# Patient Record
Sex: Male | Born: 1965 | Race: Black or African American | Hispanic: No | Marital: Single | Smoking: Current every day smoker
Health system: Southern US, Community
[De-identification: ages and names within clinical notes are randomized; demographics above are authoritative.]

## PROBLEM LIST (undated history)

## (undated) DIAGNOSIS — M1990 Unspecified osteoarthritis, unspecified site: Secondary | ICD-10-CM

## (undated) DIAGNOSIS — I10 Essential (primary) hypertension: Secondary | ICD-10-CM

## (undated) HISTORY — PX: TONSILLECTOMY: SUR1361

---

## 1998-03-21 ENCOUNTER — Emergency Department (HOSPITAL_COMMUNITY): Admission: EM | Admit: 1998-03-21 | Discharge: 1998-03-21 | Payer: Self-pay | Admitting: Emergency Medicine

## 1999-02-27 ENCOUNTER — Emergency Department (HOSPITAL_COMMUNITY): Admission: EM | Admit: 1999-02-27 | Discharge: 1999-02-27 | Payer: Self-pay | Admitting: Emergency Medicine

## 1999-02-27 ENCOUNTER — Encounter: Payer: Self-pay | Admitting: Emergency Medicine

## 2005-01-05 ENCOUNTER — Emergency Department (HOSPITAL_COMMUNITY): Admission: EM | Admit: 2005-01-05 | Discharge: 2005-01-05 | Payer: Self-pay | Admitting: Emergency Medicine

## 2005-05-04 ENCOUNTER — Emergency Department (HOSPITAL_COMMUNITY): Admission: EM | Admit: 2005-05-04 | Discharge: 2005-05-04 | Payer: Self-pay | Admitting: Emergency Medicine

## 2005-11-17 ENCOUNTER — Emergency Department (HOSPITAL_COMMUNITY): Admission: EM | Admit: 2005-11-17 | Discharge: 2005-11-17 | Payer: Self-pay | Admitting: Emergency Medicine

## 2006-09-10 ENCOUNTER — Emergency Department (HOSPITAL_COMMUNITY): Admission: EM | Admit: 2006-09-10 | Discharge: 2006-09-11 | Payer: Self-pay | Admitting: *Deleted

## 2007-04-14 IMAGING — CR DG HIP COMPLETE 2+V*R*
3 series · 3 of 3 positions shown · non-contrast
Comparison: none

CLINICAL DATA: Pain.
 RIGHT HIP - 2 VIEW:
 There is no evidence of hip fracture or dislocation.  There is no evidence of arthropathy or other focal bone abnormality.

[t pelvis a.p.]
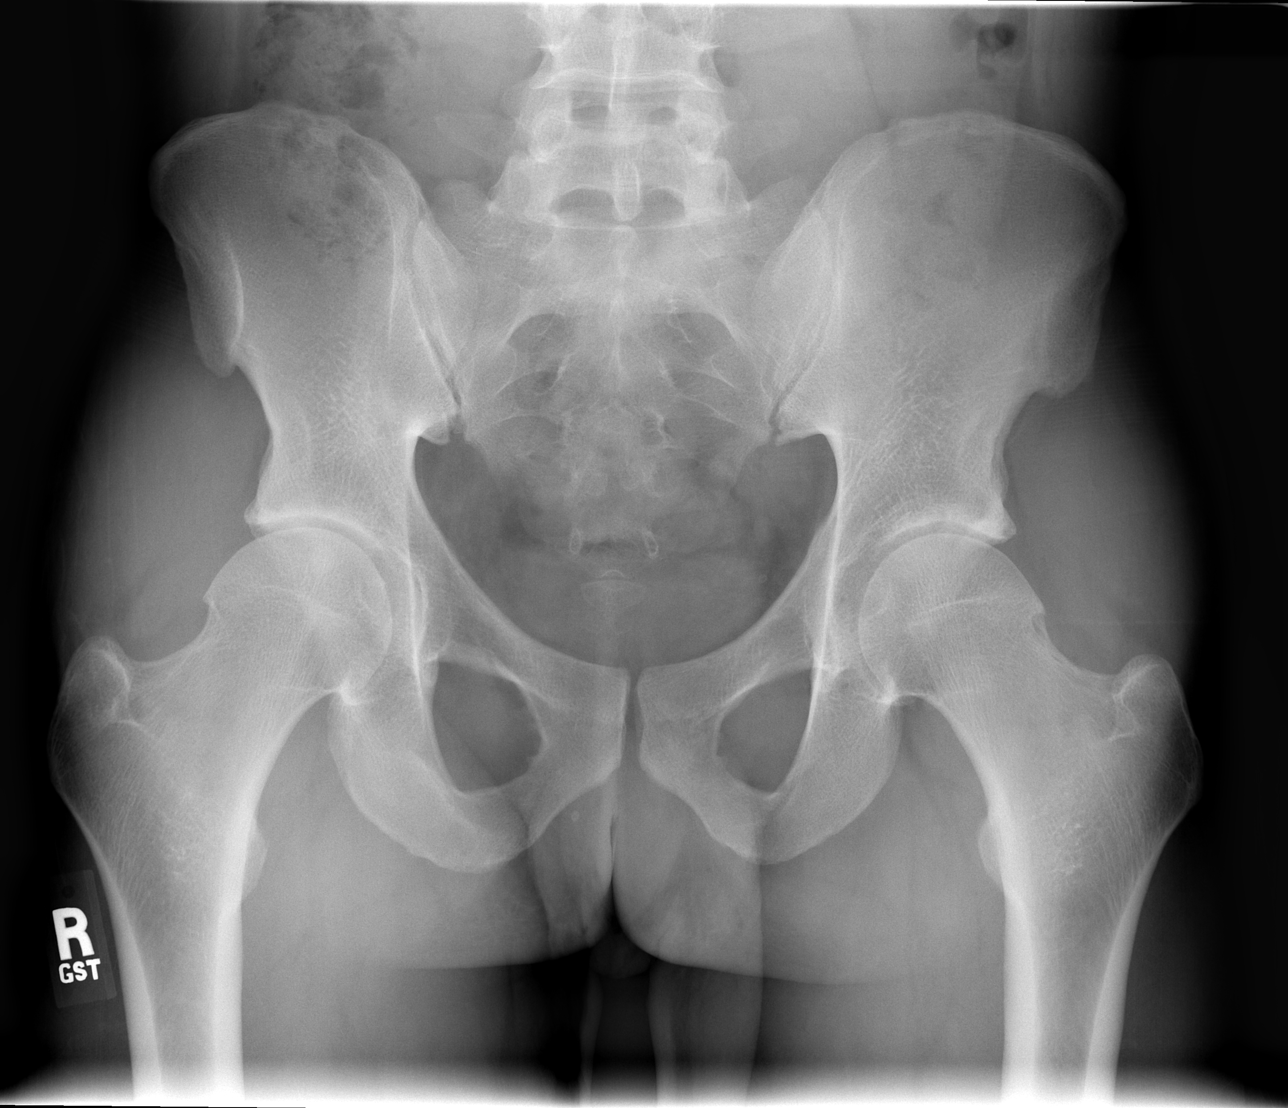

[t hip ap right]
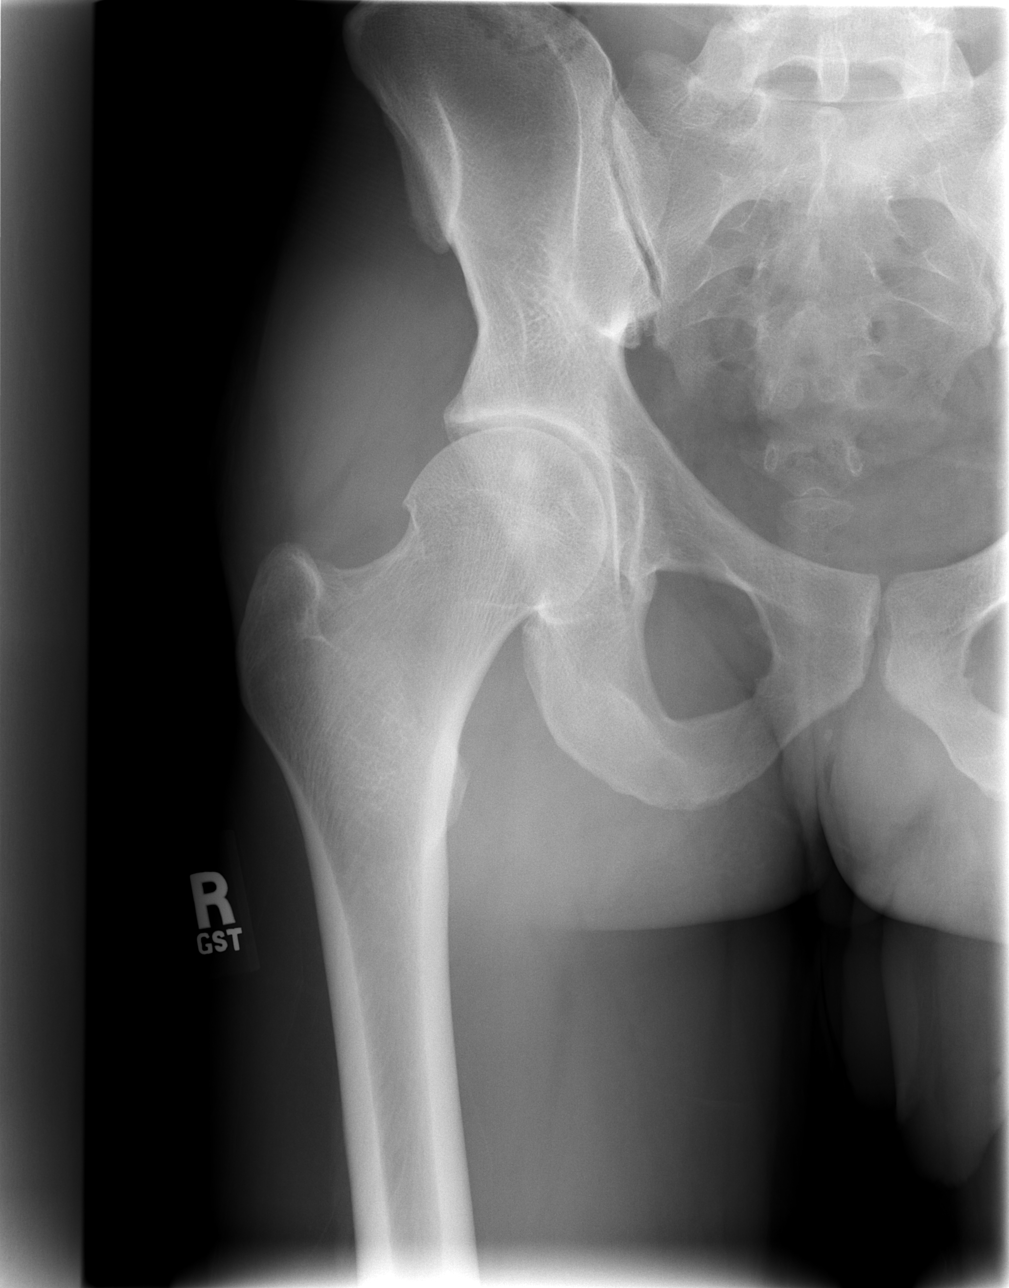

[t hip frog leg right]
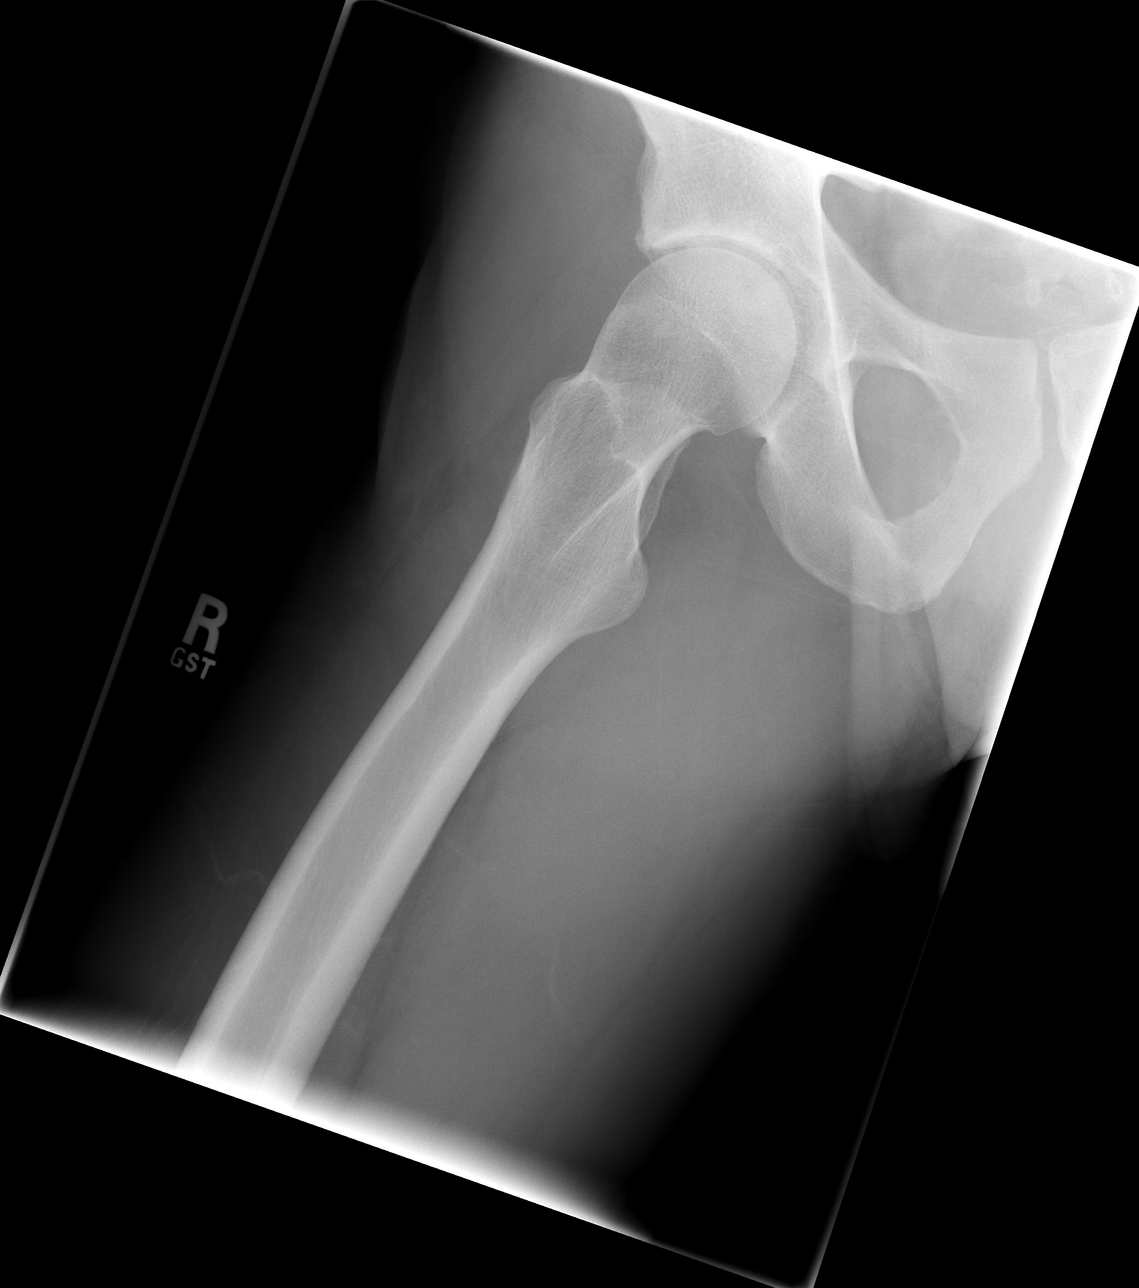

[3 of 3 positions shown; findings below may reference images not displayed]

IMPRESSION: Negative.

## 2007-04-27 ENCOUNTER — Emergency Department (HOSPITAL_COMMUNITY): Admission: EM | Admit: 2007-04-27 | Discharge: 2007-04-27 | Payer: Self-pay | Admitting: Emergency Medicine

## 2007-10-21 ENCOUNTER — Emergency Department (HOSPITAL_COMMUNITY): Admission: EM | Admit: 2007-10-21 | Discharge: 2007-10-21 | Payer: Self-pay | Admitting: Emergency Medicine

## 2007-12-28 ENCOUNTER — Emergency Department (HOSPITAL_COMMUNITY): Admission: EM | Admit: 2007-12-28 | Discharge: 2007-12-28 | Payer: Self-pay | Admitting: Emergency Medicine

## 2008-03-07 ENCOUNTER — Emergency Department (HOSPITAL_COMMUNITY): Admission: EM | Admit: 2008-03-07 | Discharge: 2008-03-08 | Payer: Self-pay | Admitting: Emergency Medicine

## 2008-04-14 ENCOUNTER — Emergency Department (HOSPITAL_COMMUNITY): Admission: EM | Admit: 2008-04-14 | Discharge: 2008-04-14 | Payer: Self-pay | Admitting: Emergency Medicine

## 2008-11-14 ENCOUNTER — Emergency Department (HOSPITAL_COMMUNITY): Admission: EM | Admit: 2008-11-14 | Discharge: 2008-11-14 | Payer: Self-pay | Admitting: Emergency Medicine

## 2009-01-11 ENCOUNTER — Emergency Department (HOSPITAL_COMMUNITY): Admission: EM | Admit: 2009-01-11 | Discharge: 2009-01-12 | Payer: Self-pay | Admitting: Emergency Medicine

## 2010-11-14 LAB — COMPREHENSIVE METABOLIC PANEL
ALT: 22 U/L (ref 0–53)
AST: 35 U/L (ref 0–37)
Alkaline Phosphatase: 68 U/L (ref 39–117)
CO2: 28 mEq/L (ref 19–32)
Calcium: 9.2 mg/dL (ref 8.4–10.5)
Chloride: 108 mEq/L (ref 96–112)
GFR calc Af Amer: >60 mL/min (ref >60)
GFR calc non Af Amer: >60 mL/min (ref >60)
Glucose, Bld: 154 mg/dL — ABNORMAL HIGH (ref 70–99)
Sodium: 144 mEq/L (ref 135–145)
Total Bilirubin: 0.7 mg/dL (ref 0.3–1.2)

## 2010-11-14 LAB — DIFFERENTIAL
Basophils Absolute: 0.1 10*3/uL (ref 0.0–0.1)
Basophils Relative: 0 % (ref 0–1)
Eosinophils Absolute: 0 10*3/uL (ref 0.0–0.7)
Eosinophils Relative: 0 % (ref 0–5)
Neutrophils Relative %: 84 % — ABNORMAL HIGH (ref 43–77)

## 2010-11-14 LAB — CBC
HCT: 42.7 % (ref 39.0–52.0)
Hemoglobin: 15 g/dL (ref 13.0–17.0)
MCHC: 35.2 g/dL (ref 30.0–36.0)
MCV: 86.7 fL (ref 78.0–100.0)
Platelets: 223 10*3/uL (ref 150–400)
RBC: 4.92 MIL/uL (ref 4.22–5.81)
RDW: 14.4 % (ref 11.5–15.5)
WBC: 18.5 10*3/uL — ABNORMAL HIGH (ref 4.0–10.5)

## 2010-11-14 LAB — GLUCOSE, CAPILLARY: Glucose-Capillary: 147 mg/dL — ABNORMAL HIGH (ref 70–99)

## 2010-11-14 LAB — LIPASE, BLOOD: Lipase: 22 U/L (ref 11–59)

## 2010-11-15 ENCOUNTER — Inpatient Hospital Stay (HOSPITAL_COMMUNITY)
Admission: EM | Admit: 2010-11-15 | Discharge: 2010-11-16 | DRG: 312 | Disposition: A | Discharge status: Home / Self Care | Payer: Self-pay | Attending: Internal Medicine | Admitting: Internal Medicine

## 2010-11-15 ENCOUNTER — Emergency Department (HOSPITAL_COMMUNITY): Payer: Self-pay

## 2010-11-15 DIAGNOSIS — D72829 Elevated white blood cell count, unspecified: Secondary | ICD-10-CM | POA: Diagnosis present

## 2010-11-15 DIAGNOSIS — R0789 Other chest pain: Secondary | ICD-10-CM | POA: Diagnosis present

## 2010-11-15 DIAGNOSIS — F17200 Nicotine dependence, unspecified, uncomplicated: Secondary | ICD-10-CM | POA: Diagnosis present

## 2010-11-15 DIAGNOSIS — Z9181 History of falling: Secondary | ICD-10-CM

## 2010-11-15 DIAGNOSIS — R55 Syncope and collapse: Principal | ICD-10-CM | POA: Diagnosis present

## 2010-11-15 LAB — CBC
MCH: 29.5 pg (ref 26.0–34.0)
MCV: 80.5 fL (ref 78.0–100.0)
Platelets: 225 10*3/uL (ref 150–400)
RDW: 13.3 % (ref 11.5–15.5)
WBC: 17.1 10*3/uL — ABNORMAL HIGH (ref 4.0–10.5)

## 2010-11-15 LAB — POCT I-STAT, CHEM 8
Calcium, Ion: 1 mmol/L — ABNORMAL LOW (ref 1.12–1.32)
Chloride: 105 mEq/L (ref 96–112)
Glucose, Bld: 85 mg/dL (ref 70–99)
HCT: 55 % — ABNORMAL HIGH (ref 39.0–52.0)
Hemoglobin: 18.7 g/dL — ABNORMAL HIGH (ref 13.0–17.0)
TCO2: 25 mmol/L (ref 0–100)

## 2010-11-15 LAB — TROPONIN I: Troponin I: 0.01 ng/mL (ref 0.00–0.06)

## 2010-11-15 LAB — DIFFERENTIAL
Basophils Relative: 0 % (ref 0–1)
Eosinophils Absolute: 0 10*3/uL (ref 0.0–0.7)
Eosinophils Relative: 0 % (ref 0–5)
Lymphs Abs: 2.4 10*3/uL (ref 0.7–4.0)
Neutrophils Relative %: 80 % — ABNORMAL HIGH (ref 43–77)

## 2010-11-15 LAB — PROTIME-INR: Prothrombin Time: 14.1 seconds (ref 11.6–15.2)

## 2010-11-15 LAB — POCT CARDIAC MARKERS: Myoglobin, poc: 104 ng/mL (ref 12–200)

## 2010-11-15 LAB — CK TOTAL AND CKMB (NOT AT ARMC)
CK, MB: 8.5 ng/mL (ref 0.3–4.0)
Total CK: 527 U/L — ABNORMAL HIGH (ref 7–232)

## 2010-11-16 DIAGNOSIS — R55 Syncope and collapse: Secondary | ICD-10-CM

## 2010-11-16 LAB — CARDIAC PANEL(CRET KIN+CKTOT+MB+TROPI)
CK, MB: 4.9 ng/mL — ABNORMAL HIGH (ref 0.3–4.0)
CK, MB: 4.3 ng/mL — ABNORMAL HIGH (ref 0.3–4.0)
Relative Index: 1.6 (ref 0.0–2.5)
Total CK: 298 U/L — ABNORMAL HIGH (ref 7–232)
Total CK: 273 U/L — ABNORMAL HIGH (ref 7–232)
Troponin I: <0.01 ng/mL (ref 0.00–0.06)
Troponin I: <0.01 ng/mL (ref 0.00–0.06)

## 2010-11-16 LAB — DIFFERENTIAL
Eosinophils Absolute: 0.1 10*3/uL (ref 0.0–0.7)
Eosinophils Relative: 1 % (ref 0–5)
Lymphs Abs: 4.1 10*3/uL — ABNORMAL HIGH (ref 0.7–4.0)
Monocytes Relative: 9 % (ref 3–12)

## 2010-11-16 LAB — CBC
MCH: 29.6 pg (ref 26.0–34.0)
MCHC: 36.8 g/dL — ABNORMAL HIGH (ref 30.0–36.0)
MCV: 80.5 fL (ref 78.0–100.0)
Platelets: 228 10*3/uL (ref 150–400)
RDW: 13.4 % (ref 11.5–15.5)

## 2010-11-16 LAB — BASIC METABOLIC PANEL
BUN: 12 mg/dL (ref 6–23)
Calcium: 8.5 mg/dL (ref 8.4–10.5)
Creatinine, Ser: 1.06 mg/dL (ref 0.4–1.5)
GFR calc non Af Amer: >60 mL/min (ref >60)

## 2010-11-16 LAB — LIPID PANEL
HDL: 40 mg/dL (ref >39)
Triglycerides: 69 mg/dL (ref <150)

## 2010-11-16 LAB — VITAMIN B12: Vitamin B-12: 508 pg/mL (ref 211–911)

## 2010-11-16 LAB — SEDIMENTATION RATE: Sed Rate: 1 mm/hr (ref 0–16)

## 2010-11-16 LAB — D-DIMER, QUANTITATIVE: D-Dimer, Quant: 0.22 ug/mL-FEU (ref 0.00–0.48)

## 2010-11-16 LAB — FOLATE: Folate: 8.4 ng/mL

## 2010-11-17 NOTE — H&P (Signed)
Lance Stone, Lance Stone               ACCOUNT NO.:  [id number]  MEDICAL RECORD NO.:  [id number]           PATIENT TYPE:  E  LOCATION:  [facility]                         FACILITY:  [facility]  PHYSICIAN:  Homero Fellers, MD   DATE OF BIRTH:  [birth year]  DATE OF ADMISSION:  11/15/2010 DATE OF DISCHARGE:                             HISTORY & PHYSICAL   PRIMARY CARE PHYSICIAN:  None.  CHIEF COMPLAINT:  Syncope and vague chest pain.  HISTORY OF PRESENT ILLNESS:  This is a 45 year old African American gentleman with no significant past medical history who presented with a witnessed syncopal episode earlier today.  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  He gave a unit of plasma earlier today and upon returning home he started feeling dizzy which persisted despite taking some water which he thought would make things better.  The patient fell forward bumping his chin against the kitchen table which required about 4-5 stitches in the emergency room.  He felt dizzy and lightheaded just before the fall.  There was no spinning sensation.  There was some accompanying nausea but no vomiting.  There was no fecal or urinary incontinence or any weakness or seizure activity.  The patient has no prior history of syncope.  In the emergency room, he was not hypotensive at any point.  When he presented, his EKG showed ST elevation and code STEMI was called, for which he was evaluated by cardiologist.  His ST elevation is in several leads and he also has some PR depression in leads II, III, and aVF.  His cardiac enzymes are unremarkable.  It was ruled as a case of pericarditis by cardiologist, so non-STEMI approach was not pursued.  The patient has been having vague chest pain over the past 1-2 months, which he describes as pleuritic and dull in nature.  He also described a pressure- like sensation over his chest.  He has never had a stress test before. There are no urinary  symptoms, leg swelling, palpitation, orthopnea, PND, or headaches.  PAST MEDICAL HISTORY:  None.  CURRENT MEDICATIONS:  None.  ALLERGIES:  None.  SOCIAL HISTORY:  Smokes about half pack per day, drinks 1 or 2 beers a day and on weekends takes two 14-ounce beer.  No IV drug abuse but uses marijuana.  FAMILY HISTORY:  Positive for four-vessel CABG in his uncle.  Ten-point review of systems is negative except as above.  PHYSICAL EXAMINATION:  VITAL SIGNS:  Blood pressure is 134/72, pulse 81, respirations 18, temperature is 98.7. GENERAL:  The patient is lying in bed, appeared comfortable, in no distress.  He is nonhypoxic at 97%. HEENT:  Pallor. NECK:  Supple.  No JVD. LUNGS:  Clear breath sounds bilaterally to auscultation. HEART:  S1 and S2.  No murmurs or gallops.  He has a clear friction rub when he leans forward. ABDOMEN:  Full, soft, nontender.  Bowel sounds present.  No masses. EXTREMITIES:  No edema, clubbing, or cyanosis. NEUROLOGIC:  No focal deficits.  LABORATORY DATA:  White count is 17,000 with mild left shift, hemoglobin is 17.4, platelet count is  225.  Chemistry: Sodium is 142, potassium 4, BUN 12, creatinine 1.3, glucose 85.  Cardiac enzymes x1 is negative. CT of the head showed no acute finding. CT of the cervical spine also showed no fracture or subluxation. Urinalysis is pending. Again, EKG showed normal sinus rhythm with diffuse ST-elevation in precordial leads with accompanying PR depression. Chest x-ray showed no acute cardiopulmonary disease.  ASSESSMENT:  This is a 45 year old man admitted with: 1. Syncope to rule out a witnessed period of vasovagal episode is most     likely but we also need to rule out structural heart disease. 2. Acute pericarditis. 3. Leukocytosis.  This may also support the diagnosis of acute     pericarditis.  There is no other focus of infection at this time. 4. Tobacco abuse.  PLAN:  Admit to telemetry.  Get serial cardiac  enzymes and further EKG. Get ESR.  Check lipid profile tomorrow.  Get two sets of blood cultures. The patient will be followed by cardiologist on this admission.  A 2-D echo will be requested as well as carotid Doppler.  The patient will be commenced on ibuprofen 600 mg t.i.d. for treatment of pericarditis.  I will also put him on proton pump inhibitor to protect his stomach.  B12 and folic acid levels will be checked.  He will be on NicoDerm for tobacco abuse.  His condition is fairly stable.     Homero Fellers, MD     FA/MEDQ  D:  11/15/2010  T:  11/15/2010  Job:  [id number]  Electronically Signed by Homero Fellers  on 11/17/2010 09:28:07 AM

## 2010-11-22 LAB — CULTURE, BLOOD (ROUTINE X 2)
Culture  Setup Time: 201204110908
Culture: NO GROWTH

## 2010-11-22 NOTE — Discharge Summary (Signed)
Lance Stone, Lance Stone               ACCOUNT NO.:  [id number]  MEDICAL RECORD NO.:  [id number]           PATIENT TYPE:  I  LOCATION:  4735                         FACILITY:  [facility]  PHYSICIAN:  Hartley Barefoot, MD    DATE OF BIRTH:  08/21/1965  DATE OF ADMISSION:  11/15/2010 DATE OF DISCHARGE:  11/16/2010                              DISCHARGE SUMMARY   DISCHARGE DIAGNOSIS: 1. Atypical chest pain. 2. Vasovagal syncope.  REVIEW OF SYSTEMS: 1. Left ventricular hypertrophy. 2. Abnormal EKG with ST elevations thought to be secondary to left     ventricular hypertrophy per Cardiology recommendations.  DISCHARGE MEDICATIONS: 1. Tylenol 650 mg every 4 hours as needed for pain. 2. Protonix 40 mg p.o. daily. 3. Ibuprofen 2 tablets by mouth every 6 hours as needed for pain.  MEDICATIONS STOPPED:  Aleve, naproxen.  DISPOSITION AND FOLLOWUP:  Lance Stone will follow with [facility] for left ventricular hypertrophy.  BRIEF HISTORY OF PRESENT ILLNESS:  This is a 45 year old with no prior past medical history who presents after a witnessed syncopal episode. 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  He gave a unit of plasma early on the morning of admission, and upon returning home he started feeling dizzy which persisted by taking some water which he thought will make things better.  He felt poor and hit his chin against the kitchen table.  He required 4-5 stitches in the emergency room.  He felt dizzy and lightheadedness prior to the fall.  He was found to have an EKG, ST elevation, and code STEMI was called for which Cardiology evaluated the patient.  Cardiology canceled the code.  He relates some on and off chest pain for the last 2 or 3 months, worse with stretching and deep breaths.  HOSPITAL COURSE: 1. Syncope, likely vasovagal, no focal neurologic deficit.  Carotid     Dopplers show clean coronary artery.  A 2-D echo was     negative for  aortic stenosis. 2. Chest pain, atypical, positional and pressure like.  I will get a D-     dimer.  If D-dimer is negative, the patient will be discharged     home.  He denies chest pain at this time.  He will be discharged on     Protonix daily.  Cardiology does not think that this is     pericarditis.  He will need to follow with Cardiology for further     evaluation of left ventricular hypertrophy.  A 2-D echo showed     normal ejection fraction and left ventricular hypertrophy. 3. Leukocytosis, likely stress-related hemoconcentration.  The patient     afebrile.  White blood cell decreased to 14 off antibiotics.  On the day of discharge, the patient was in improved condition, no chest pain.  Blood pressure 122/70, sating 100 on room air, respirations 20, pulse 67, temperature 98.2.  The patient was discharged in improved condition.     Hartley Barefoot, MD     BR/MEDQ  D:  11/16/2010  T:  11/17/2010  Job:  [id number]  Electronically Signed by  Hartley Barefoot MD on 11/22/2010 09:03:46 PM

## 2011-03-17 ENCOUNTER — Emergency Department (HOSPITAL_COMMUNITY): Payer: Self-pay

## 2011-03-17 ENCOUNTER — Emergency Department (HOSPITAL_COMMUNITY)
Admission: EM | Admit: 2011-03-17 | Discharge: 2011-03-17 | Discharge status: Against Medical Advice | Payer: Self-pay | Attending: Emergency Medicine | Admitting: Emergency Medicine

## 2011-03-17 DIAGNOSIS — Y9289 Other specified places as the place of occurrence of the external cause: Secondary | ICD-10-CM | POA: Insufficient documentation

## 2011-03-17 DIAGNOSIS — R296 Repeated falls: Secondary | ICD-10-CM | POA: Insufficient documentation

## 2011-03-17 DIAGNOSIS — M25569 Pain in unspecified knee: Secondary | ICD-10-CM | POA: Insufficient documentation

## 2011-05-23 ENCOUNTER — Emergency Department (HOSPITAL_COMMUNITY)
Admission: EM | Admit: 2011-05-23 | Discharge: 2011-05-24 | Disposition: A | Discharge status: Other Acute Inpatient Hospital | Payer: Self-pay | Attending: Emergency Medicine | Admitting: Emergency Medicine

## 2011-05-23 DIAGNOSIS — F329 Major depressive disorder, single episode, unspecified: Secondary | ICD-10-CM | POA: Insufficient documentation

## 2011-05-23 DIAGNOSIS — F3289 Other specified depressive episodes: Secondary | ICD-10-CM | POA: Insufficient documentation

## 2011-05-24 ENCOUNTER — Inpatient Hospital Stay (HOSPITAL_COMMUNITY)
Admission: EM | Admit: 2011-05-24 | Discharge: 2011-05-24 | DRG: 881 | Disposition: A | Discharge status: Home / Self Care | Payer: PRIVATE HEALTH INSURANCE | Attending: Psychiatry | Admitting: Psychiatry

## 2011-05-24 DIAGNOSIS — F1010 Alcohol abuse, uncomplicated: Secondary | ICD-10-CM

## 2011-05-24 DIAGNOSIS — F1220 Cannabis dependence, uncomplicated: Secondary | ICD-10-CM

## 2011-05-24 DIAGNOSIS — F39 Unspecified mood [affective] disorder: Secondary | ICD-10-CM

## 2011-05-24 DIAGNOSIS — F4321 Adjustment disorder with depressed mood: Principal | ICD-10-CM

## 2011-05-24 LAB — DIFFERENTIAL
Basophils Absolute: 0 10*3/uL (ref 0.0–0.1)
Eosinophils Absolute: 0.1 10*3/uL (ref 0.0–0.7)
Eosinophils Relative: 1 % (ref 0–5)
Lymphocytes Relative: 31 % (ref 12–46)
Monocytes Absolute: 1.2 10*3/uL — ABNORMAL HIGH (ref 0.1–1.0)

## 2011-05-24 LAB — RAPID URINE DRUG SCREEN, HOSP PERFORMED
Amphetamines: NOT DETECTED
Benzodiazepines: NOT DETECTED
Opiates: NOT DETECTED
Tetrahydrocannabinol: POSITIVE — AB

## 2011-05-24 LAB — COMPREHENSIVE METABOLIC PANEL
AST: 23 U/L (ref 0–37)
Albumin: 4.2 g/dL (ref 3.5–5.2)
Alkaline Phosphatase: 78 U/L (ref 39–117)
BUN: 13 mg/dL (ref 6–23)
Creatinine, Ser: 0.93 mg/dL (ref 0.50–1.35)
Potassium: 3.8 mEq/L (ref 3.5–5.1)
Total Protein: 7.5 g/dL (ref 6.0–8.3)

## 2011-05-24 LAB — ETHANOL: Alcohol, Ethyl (B): <11 mg/dL (ref 0–11)

## 2011-05-24 LAB — CBC
HCT: 41.3 % (ref 39.0–52.0)
MCHC: 36.8 g/dL — ABNORMAL HIGH (ref 30.0–36.0)
Platelets: 259 10*3/uL (ref 150–400)
RDW: 13.7 % (ref 11.5–15.5)
WBC: 12.7 10*3/uL — ABNORMAL HIGH (ref 4.0–10.5)

## 2011-05-26 NOTE — Discharge Summary (Signed)
  Lance Stone, Lance Stone NO.:  [id number]  MEDICAL RECORD NO.:  [id number]  LOCATION:  0301                          FACILITY:  [facility]  PHYSICIAN:  Orson Aloe, MD       DATE OF BIRTH:  1966/01/19  DATE OF ADMISSION:  05/24/2011 DATE OF DISCHARGE:  05/24/2011                              DISCHARGE SUMMARY   REASON FOR HOSPITALIZATION:  The patient had been drinking and was requesting to talk to a counselor and when he was talking to the counselor he said something about he might want to hurt himself.  He is now sobered up in the emergency room, and has no desire to hurt himself. His alcohol level was actually less than 11 in the emergency room.  SIGNIFICANT FINDINGS:  Is that his labs and tests were all within normal limits.  He has no suicidal or homicidal ideation noted and he sees the need for continued counseling but no need for any other treatment. There does not seem to be any need obvious in the interview setting.  FINAL DIAGNOSIS:  Adjustment disorder with depressed mood, alcohol use.  CONDITION ON DISCHARGE:  Stable.  Discharged to his own recognizance.          ______________________________ Orson Aloe, MD     EW/MEDQ  D:  05/24/2011  T:  05/25/2011  Job:  [id number]  Electronically Signed by Orson Aloe  on 05/26/2011 10:23:07 AM

## 2011-11-10 ENCOUNTER — Emergency Department (HOSPITAL_COMMUNITY)
Admission: EM | Admit: 2011-11-10 | Discharge: 2011-11-10 | Disposition: A | Discharge status: Home / Self Care | Payer: Self-pay | Attending: Emergency Medicine | Admitting: Emergency Medicine

## 2011-11-10 ENCOUNTER — Emergency Department (HOSPITAL_COMMUNITY): Payer: Self-pay

## 2011-11-10 ENCOUNTER — Encounter (HOSPITAL_COMMUNITY): Payer: Self-pay | Admitting: *Deleted

## 2011-11-10 DIAGNOSIS — M25473 Effusion, unspecified ankle: Secondary | ICD-10-CM | POA: Insufficient documentation

## 2011-11-10 DIAGNOSIS — M25476 Effusion, unspecified foot: Secondary | ICD-10-CM | POA: Insufficient documentation

## 2011-11-10 DIAGNOSIS — S93499A Sprain of other ligament of unspecified ankle, initial encounter: Secondary | ICD-10-CM | POA: Insufficient documentation

## 2011-11-10 DIAGNOSIS — M25579 Pain in unspecified ankle and joints of unspecified foot: Secondary | ICD-10-CM | POA: Insufficient documentation

## 2011-11-10 DIAGNOSIS — S96819A Strain of other specified muscles and tendons at ankle and foot level, unspecified foot, initial encounter: Secondary | ICD-10-CM | POA: Insufficient documentation

## 2011-11-10 DIAGNOSIS — S86019A Strain of unspecified Achilles tendon, initial encounter: Secondary | ICD-10-CM

## 2011-11-10 DIAGNOSIS — W010XXA Fall on same level from slipping, tripping and stumbling without subsequent striking against object, initial encounter: Secondary | ICD-10-CM | POA: Insufficient documentation

## 2011-11-10 MED ORDER — IBUPROFEN 800 MG PO TABS
800.0000 mg | ORAL_TABLET | Freq: Once | ORAL | Status: AC
  Administered 2011-11-10: 800 mg via ORAL
  Filled 2011-11-10: qty 1

## 2011-11-10 MED ORDER — IBUPROFEN 800 MG PO TABS: 800.0000 mg | ORAL_TABLET | Freq: Three times a day (TID) | ORAL | Status: AC

## 2011-11-10 MED ORDER — OXYCODONE-ACETAMINOPHEN 5-325 MG PO TABS: 2.0000 | ORAL_TABLET | ORAL | Status: AC | PRN

## 2011-11-10 NOTE — ED Notes (Signed)
Pt states falling and feeling ankle and achilles tendon 'pop' while running 2 days ago. Swelling noted to lower right extremity. Tenderness palpated over tendon. Cms/rom intact. Pt in no acute distress. Ambulatory with crutches.

## 2011-11-10 NOTE — ED Notes (Signed)
Called ortho.  

## 2011-11-10 NOTE — ED Provider Notes (Signed)
History     CSN: [id number]  Arrival date & time 11/10/11  2440   First MD Initiated Contact with Patient 11/10/11 1105      Chief Complaint  Patient presents with  . Ankle Pain    (Consider location/radiation/quality/duration/timing/severity/associated sxs/prior treatment) Patient is a 47 y.o. male presenting with ankle pain.  Ankle Pain     Patient states he injured his right ankle several days ago he was racing a friend. He states he began racing and fell to the ground. He states that the pain began at this time. He states that he tore his Achilles tendon. He is asked if he injured himself when he fell and replies "you just don't get it". I "I told you it happened" patient does not answer any other questions about the way he was injured or what the symptoms have been like since his injury stating that he has pain that we can see in his right ankle.  History reviewed. No pertinent past medical history.  Past Surgical History  Procedure Date  . Tonsillectomy     History reviewed. No pertinent family history.  History  Substance Use Topics  . Smoking status: Current Everyday Smoker    Types: Cigarettes  . Smokeless tobacco: Not on file  . Alcohol Use: Yes     occasional      Review of Systems  Constitutional:       The patient denies recent fluoroquinolone use.  All other systems reviewed and are negative.    Allergies  Review of patient's allergies indicates no known allergies.  Home Medications   Current Outpatient Rx  Name Route Sig Dispense Refill  . POLYVINYL ALCOHOL 1.4 % OP SOLN  1 drop as needed.      BP 136/85  Pulse 100  Temp(Src) 97.8 F (36.6 C) (Oral)  Resp 16  SpO2 98%  Physical Exam  Vitals reviewed. Constitutional: He is oriented to person, place, and time. He appears well-developed and well-nourished.  HENT:  Head: Normocephalic and atraumatic.  Musculoskeletal:       Right foot: He exhibits tenderness and swelling. He exhibits  no crepitus and no deformity.       Feet:       Patient refuses further exam after initial palpation but is tender diffusely over the Achilles tendon. There is not appear to be any bony tenderness. Dorsal talus pulses are 2+. Patient can plantar flex his foot against mild pressure. No calf deformity is noted. \ Thompson's test is positive on the right.  Neurological: He is alert and oriented to person, place, and time.  Skin: Skin is warm and dry.  Psychiatric: His affect is angry and inappropriate. He is agitated and aggressive.    ED Course  Procedures (including critical care time)  Labs Reviewed - No data to display No results found.   No diagnosis found.    MDM  No information on file.  Dg Ankle Complete Right  11/10/2011  *RADIOLOGY REPORT*  Clinical Data: Pain and swelling.  Fell 2 days ago.  RIGHT ANKLE - COMPLETE 3+ VIEW  Comparison: None.  Findings: Lateral malleolar soft tissue swelling.  No fracture or dislocation.  IMPRESSION: .  Soft tissue swelling lateral malleolar region.  No fracture or dislocation.  Original Report Authenticated By: Fuller Canada, M.D.    Patient may have partial rupture of his Achilles tendon. He will need to be reexamined when the swelling and pain have decreased. He is currently on crutches  player. He'll be placed in a splint and referred to orthopedics. Patient's care discussed with Dr. Rayburn Ma. Patient is placed in posterior splint with plantar flexion. He is instructed to followup in the office next week.       Hilario Quarry, MD 11/10/11 1210

## 2011-11-10 NOTE — Discharge Instructions (Signed)
Partial (Incomplete) Achilles Tendon Rupture  Tendons are the tough fibrous and stretchy (elastic) tissues that connect muscle to bone. The Achilles tendon is the large cord-like structure (tendon) in the back of the leg, just above the foot. It attaches the large muscles of the lower leg to the heel bone. You can feel this as the large cord just above the heel. The diagnosis of incomplete Achilles tendon tear (rupture) is made by examination. X-rays will determine the extent of the damage (injury). The injury may be casted or immobilized for 6 to 10 weeks. An incomplete tear of the tendon may repair itself with rest and immobilization. Immobilization means that the injured tendon is kept in position with a cast or splint. It is not allowed to move. Once your caregiver feels you have healed well enough, he or she will provide exercises you can do to make the injured tendon feel better (rehabilitate). HOME CARE INSTRUCTIONS   Keep the leg elevated above the level of the heart (the center of the chest) at all times when not using the bathroom. Do not dangle the leg over a chair, couch, or bed. When lying down, elevate your leg on a couple pillows. Elevation prevents swelling and reduces pain.   Apply ice to the injury for 15 to 20 minutes, 3 to 4 times per day. Put the ice in a plastic bag and place a towel between the bag of ice and your skin, splint, or immobilization device.   Use crutches and move about only as instructed.   Avoid use other than gentle range of motion of the toes while the tendon is painful. Do not resume use until instructed by your caregiver. Usually, full rehabilitation will begin sometime after the casts or splints are removed. Then begin use gradually, as directed. Do not increase use to the point of pain. If pain does develop, decrease use and continue the above measures. Gradually increase activities that do not cause discomfort until you achieve normal use without pain.   Do  not drive a car until your caregiver specifically tells you it is safe to do so.   Only take over-the-counter or prescription medicines for pain, discomfort, or fever as directed by your caregiver.   If your caregiver has given you a follow-up appointment, it is very important to keep that appointment. Not keeping the appointment could result in a chronic or permanent injury, pain, and disability. If there is any problem keeping the appointment, you must call back to this facility for assistance.  SEEK MEDICAL CARE IF:   Your pain and swelling increase or pain is uncontrolled with medications.   You develop new unexplained problems (symptoms) or an increase of the symptoms which brought you to your caregiver.   You develop an inability to move your toes or foot, develop warmth and swelling in your foot, or begin running an unexplained temperature.  MAKE SURE YOU:   Understand these instructions.   Will watch your condition.   Will get help right away if you are not doing well or get worse.  Document Released: 05/03/2005 Document Revised: 07/13/2011 Document Reviewed: 02/25/2008 [employer] Patient Information 2012 [employer]. 

## 2011-11-10 NOTE — ED Notes (Signed)
To ed for eval of right ankle/foot pain since running a cple days ago. States he thinks he has injured his achilles tendon. Ambulatory with crutches

## 2012-04-01 ENCOUNTER — Encounter (HOSPITAL_COMMUNITY): Payer: Self-pay | Admitting: Family Medicine

## 2012-04-01 ENCOUNTER — Emergency Department (HOSPITAL_COMMUNITY)
Admission: EM | Admit: 2012-04-01 | Discharge: 2012-04-01 | Disposition: A | Discharge status: Home / Self Care | Payer: Self-pay | Attending: Emergency Medicine | Admitting: Emergency Medicine

## 2012-04-01 DIAGNOSIS — F17200 Nicotine dependence, unspecified, uncomplicated: Secondary | ICD-10-CM | POA: Insufficient documentation

## 2012-04-01 DIAGNOSIS — M545 Low back pain, unspecified: Secondary | ICD-10-CM

## 2012-04-01 DIAGNOSIS — I10 Essential (primary) hypertension: Secondary | ICD-10-CM | POA: Insufficient documentation

## 2012-04-01 HISTORY — DX: Essential (primary) hypertension: I10

## 2012-04-01 LAB — URINALYSIS, ROUTINE W REFLEX MICROSCOPIC
Bilirubin Urine: NEGATIVE
Glucose, UA: NEGATIVE mg/dL
Hgb urine dipstick: NEGATIVE
Ketones, ur: NEGATIVE mg/dL
Leukocytes, UA: NEGATIVE
Nitrite: NEGATIVE
Protein, ur: NEGATIVE mg/dL
Specific Gravity, Urine: 1.022 (ref 1.005–1.030)
Urobilinogen, UA: 1 mg/dL (ref 0.0–1.0)
pH: 6 (ref 5.0–8.0)

## 2012-04-01 MED ORDER — IBUPROFEN 400 MG PO TABS
800.0000 mg | ORAL_TABLET | Freq: Once | ORAL | Status: AC
  Administered 2012-04-01: 800 mg via ORAL
  Filled 2012-04-01: qty 2

## 2012-04-01 MED ORDER — IBUPROFEN 800 MG PO TABS: 800.0000 mg | ORAL_TABLET | Freq: Three times a day (TID) | ORAL | Status: AC

## 2012-04-01 MED ORDER — CYCLOBENZAPRINE HCL 10 MG PO TABS: 10.0000 mg | ORAL_TABLET | Freq: Three times a day (TID) | ORAL | Status: AC | PRN

## 2012-04-01 MED ORDER — HYDROCODONE-ACETAMINOPHEN 5-325 MG PO TABS: ORAL_TABLET | ORAL | Status: AC

## 2012-04-01 NOTE — ED Provider Notes (Signed)
History     CSN: [id number]  Arrival date & time 04/01/12  1248   First MD Initiated Contact with Patient 04/01/12 1339      Chief Complaint  Patient presents with  . Back Pain    (Consider location/radiation/quality/duration/timing/severity/associated sxs/prior treatment) HPI Comments: Pt reports no trauma, 3 days of low back right side, worse with bending, twisting or movement of torso.  No fevers, chills, no N/V/D.  No hematuria or burning with urination.  No skin rash, SOB, coughing.  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  Has not taken any meds for this.  No urinary difficulty, no numbness or weakness in legs.    Patient is a 46 y.o. male presenting with back pain. The history is provided by the patient.  Back Pain  Pertinent negatives include no chest pain, no fever, no numbness, no dysuria and no weakness.    Past Medical History  Diagnosis Date  . Hypertension     Past Surgical History  Procedure Date  . Tonsillectomy     History reviewed. No pertinent family history.  History  Substance Use Topics  . Smoking status: Current Everyday Smoker    Types: Cigarettes  . Smokeless tobacco: Not on file  . Alcohol Use: Yes     occasional      Review of Systems  Constitutional: Negative for fever and chills.  Respiratory: Negative for cough, chest tightness and shortness of breath.   Cardiovascular: Negative for chest pain.  Genitourinary: Negative for dysuria, frequency, hematuria, flank pain and difficulty urinating.  Musculoskeletal: Positive for back pain.  Neurological: Negative for weakness and numbness.    Allergies  Review of patient's allergies indicates no known allergies.  Home Medications   Current Outpatient Rx  Name Route Sig Dispense Refill  . CYCLOBENZAPRINE HCL 10 MG PO TABS Oral Take 1 tablet (10 mg total) by mouth 3 (three) times daily as needed for muscle spasms. 12 tablet 0  . HYDROCODONE-ACETAMINOPHEN 5-325 MG PO TABS   1-2 tablets po q 6 hours prn moderate to severe pain 20 tablet 0  . IBUPROFEN 800 MG PO TABS Oral Take 1 tablet (800 mg total) by mouth 3 (three) times daily. 21 tablet 0    BP 122/79  Pulse 88  Temp 98.7 F (37.1 C) (Oral)  Resp 17  SpO2 100%  Physical Exam  Nursing note and vitals reviewed. Constitutional: He is oriented to person, place, and time. He appears well-developed and well-nourished.  HENT:  Head: Normocephalic and atraumatic.  Neck: Normal range of motion. Neck supple.  Cardiovascular: Normal rate.   Pulmonary/Chest: Effort normal.  Abdominal: Soft. He exhibits no distension. There is no tenderness. There is no rebound.  Musculoskeletal:       Lumbar back: He exhibits tenderness. He exhibits no bony tenderness, no edema, no deformity, no laceration, no pain and no spasm.       Back:       No deformity to back  Neurological: He is alert and oriented to person, place, and time.  Skin: Skin is warm. No rash noted.    ED Course  Procedures (including critical care time)   Labs Reviewed  URINALYSIS, ROUTINE W REFLEX MICROSCOPIC  LAB REPORT - SCANNED   No results found.   1. Lumbar pain      Ua is unremarkable.  Focal tenderness, no radiculopathy present at this time.  No urinary hesitancy or retention or incontinence noted.   MDM  Likely  muscular pain, spasm.  Pt couldn't sleep last night.  Didn't take any meds.  No radiation of pain, doesn't seem radicular, no vomiting or urinary symptoms.  If UA is ok, will treat as spasm.          Gavin Pound. Kiandra Sanguinetti, MD 04/02/12 1625

## 2012-04-01 NOTE — ED Notes (Signed)
Per pt back pain x 3 or 4 days on the right side denies injury. Denies any urinary symptoms. Denies radiation of pain.

## 2012-04-01 NOTE — ED Notes (Signed)
C/o right flank pain x 3-4 days, denies injury, UTI Sx's. Reports pain worsens with mvmt, area tender to palpation

## 2012-04-01 NOTE — Discharge Instructions (Signed)
 Back Exercises Back exercises help treat and prevent back injuries. The goal of back exercises is to increase the strength of your abdominal and back muscles and the flexibility of your back. These exercises should be started when you no longer have back pain. Back exercises include:  Pelvic Tilt. Lie on your back with your knees bent. Tilt your pelvis until the lower part of your back is against the floor. Hold this position 5 to 10 sec and repeat 5 to 10 times.   Knee to Chest. Pull first 1 knee up against your chest and hold for 20 to 30 seconds, repeat this with the other knee, and then both knees. This may be done with the other leg straight or bent, whichever feels better.   Sit-Ups or Curl-Ups. Bend your knees 90 degrees. Start with tilting your pelvis, and do a partial, slow sit-up, lifting your trunk only 30 to 45 degrees off the floor. Take at least 2 to 3 seconds for each sit-up. Do not do sit-ups with your knees out straight. If partial sit-ups are difficult, simply do the above but with only tightening your abdominal muscles and holding it as directed.   Hip-Lift. Lie on your back with your knees flexed 90 degrees. Push down with your feet and shoulders as you raise your hips a couple inches off the floor; hold for 10 seconds, repeat 5 to 10 times.   Back arches. Lie on your stomach, propping yourself up on bent elbows. Slowly press on your hands, causing an arch in your low back. Repeat 3 to 5 times. Any initial stiffness and discomfort should lessen with repetition over time.   Shoulder-Lifts. Lie face down with arms beside your body. Keep hips and torso pressed to floor as you slowly lift your head and shoulders off the floor.  Do not overdo your exercises, especially in the beginning. Exercises may cause you some mild back discomfort which lasts for a few minutes; however, if the pain is more severe, or lasts for more than 15 minutes, do not continue exercises until you see your  caregiver. Improvement with exercise therapy for back problems is slow.  See your caregivers for assistance with developing a proper back exercise program. Document Released: 08/31/2004 Document Revised: 07/13/2011 Document Reviewed: 07/24/2005 [facility] Patient Information 2012 [place], [place].    Back Pain, Adult Back pain is very common. The pain often gets better over time. The cause of back pain is usually not dangerous. Most people can learn to manage their back pain on their own.  HOME CARE   Stay active. Start with short walks on flat ground if you can. Try to walk farther each day.   Do not sit, drive, or stand in one place for more than 30 minutes. Do not stay in bed.   Do not avoid exercise or work. Activity can help your back heal faster.   Be careful when you bend or lift an object. Bend at your knees, keep the object close to you, and do not twist.   Sleep on a firm mattress. Lie on your side, and bend your knees. If you lie on your back, put a pillow under your knees.   Only take medicines as told by your doctor.   Put ice on the injured area.   Put ice in a plastic bag.   Place a towel between your skin and the bag.   Leave the ice on for 15 to 20 minutes, 3 to 4 times a  day for the first 2 to 3 days. After that, you can switch between ice and heat packs.   Ask your doctor about back exercises or massage.   Avoid feeling anxious or stressed. Find good ways to deal with stress, such as exercise.  GET HELP RIGHT AWAY IF:   Your pain does not go away with rest or medicine.   Your pain does not go away in 1 week.   You have new problems.   You do not feel well.   The pain spreads into your legs.   You cannot control when you poop (bowel movement) or pee (urinate).   Your arms or legs feel weak or lose feeling (numbness).   You feel sick to your stomach (nauseous) or throw up (vomit).   You have belly (abdominal) pain.   You feel like you may pass out  (faint).  MAKE SURE YOU:   Understand these instructions.   Will watch your condition.   Will get help right away if you are not doing well or get worse.  Document Released: 01/10/2008 Document Revised: 07/13/2011 Document Reviewed: 12/12/2010 [facility] Patient Information 2012 [place], [place].Back Pain, Adult Back pain is very common. The pain often gets better over time. The cause of back pain is usually not dangerous. Most people can learn to manage their back pain on their own.  HOME CARE   Stay active. Start with short walks on flat ground if you can. Try to walk farther each day.   Do not sit, drive, or stand in one place for more than 30 minutes. Do not stay in bed.   Do not avoid exercise or work. Activity can help your back heal faster.   Be careful when you bend or lift an object. Bend at your knees, keep the object close to you, and do not twist.   Sleep on a firm mattress. Lie on your side, and bend your knees. If you lie on your back, put a pillow under your knees.   Only take medicines as told by your doctor.   Put ice on the injured area.   Put ice in a plastic bag.   Place a towel between your skin and the bag.   Leave the ice on for 15 to 20 minutes, 3 to 4 times a day for the first 2 to 3 days. After that, you can switch between ice and heat packs.   Ask your doctor about back exercises or massage.   Avoid feeling anxious or stressed. Find good ways to deal with stress, such as exercise.  GET HELP RIGHT AWAY IF:   Your pain does not go away with rest or medicine.   Your pain does not go away in 1 week.   You have new problems.   You do not feel well.   The pain spreads into your legs.   You cannot control when you poop (bowel movement) or pee (urinate).   Your arms or legs feel weak or lose feeling (numbness).   You feel sick to your stomach (nauseous) or throw up (vomit).   You have belly (abdominal) pain.   You feel like you may pass out  (faint).  MAKE SURE YOU:   Understand these instructions.   Will watch your condition.   Will get help right away if you are not doing well or get worse.  Document Released: 01/10/2008 Document Revised: 07/13/2011 Document Reviewed: 12/12/2010 [facility] Patient Information 2012 [place], [place].     Narcotic  and benzodiazepine use may cause drowsiness, slowed breathing or dependence.  Please use with caution and do not drive, operate machinery or watch young children alone while taking them.  Taking combinations of these medications or drinking alcohol will potentiate these effects.  Flexeril  and Norco can cause drowsiness so use with caution and do not drive when taking these.

## 2013-02-10 ENCOUNTER — Emergency Department (HOSPITAL_COMMUNITY)
Admission: EM | Admit: 2013-02-10 | Discharge: 2013-02-10 | Disposition: A | Discharge status: Home / Self Care | Payer: Self-pay | Attending: Emergency Medicine | Admitting: Emergency Medicine

## 2013-02-10 ENCOUNTER — Emergency Department (HOSPITAL_COMMUNITY): Payer: Self-pay

## 2013-02-10 ENCOUNTER — Encounter (HOSPITAL_COMMUNITY): Payer: Self-pay | Admitting: Emergency Medicine

## 2013-02-10 DIAGNOSIS — S0990XA Unspecified injury of head, initial encounter: Secondary | ICD-10-CM | POA: Insufficient documentation

## 2013-02-10 DIAGNOSIS — IMO0002 Reserved for concepts with insufficient information to code with codable children: Secondary | ICD-10-CM | POA: Insufficient documentation

## 2013-02-10 DIAGNOSIS — I10 Essential (primary) hypertension: Secondary | ICD-10-CM | POA: Insufficient documentation

## 2013-02-10 DIAGNOSIS — Z791 Long term (current) use of non-steroidal anti-inflammatories (NSAID): Secondary | ICD-10-CM | POA: Insufficient documentation

## 2013-02-10 DIAGNOSIS — F17200 Nicotine dependence, unspecified, uncomplicated: Secondary | ICD-10-CM | POA: Insufficient documentation

## 2013-02-10 DIAGNOSIS — S6990XA Unspecified injury of unspecified wrist, hand and finger(s), initial encounter: Secondary | ICD-10-CM | POA: Insufficient documentation

## 2013-02-10 DIAGNOSIS — M79645 Pain in left finger(s): Secondary | ICD-10-CM

## 2013-02-10 MED ORDER — NAPROXEN 500 MG PO TABS: 500.0000 mg | ORAL_TABLET | Freq: Two times a day (BID) | ORAL | Status: DC
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
## 2013-02-10 NOTE — ED Notes (Signed)
Change level of acuity per PA
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
## 2013-02-10 NOTE — Progress Notes (Signed)
Orthopedic Tech Progress Note Patient Details:  Lance Stone July 27, 1966 [id number]  Ortho Devices Type of Ortho Device: Thumb spica splint Ortho Device/Splint Interventions: Ordered   Shawnie Pons 02/10/2013, 12:43 PM

## 2013-02-11 NOTE — ED Provider Notes (Signed)
Medical screening examination/treatment/procedure(s) were performed by non-physician practitioner and as supervising physician I was immediately available for consultation/collaboration.  Toy Baker, MD 02/11/13 [phone]

## 2015-11-29 ENCOUNTER — Emergency Department (HOSPITAL_COMMUNITY)
Admission: EM | Admit: 2015-11-29 | Discharge: 2015-11-29 | Disposition: A | Discharge status: Home / Self Care | Payer: PRIVATE HEALTH INSURANCE | Attending: Emergency Medicine | Admitting: Emergency Medicine

## 2015-11-29 ENCOUNTER — Encounter (HOSPITAL_COMMUNITY): Payer: Self-pay

## 2015-11-29 DIAGNOSIS — F17210 Nicotine dependence, cigarettes, uncomplicated: Secondary | ICD-10-CM | POA: Insufficient documentation

## 2015-11-29 DIAGNOSIS — Z791 Long term (current) use of non-steroidal anti-inflammatories (NSAID): Secondary | ICD-10-CM | POA: Insufficient documentation

## 2015-11-29 DIAGNOSIS — M1990 Unspecified osteoarthritis, unspecified site: Secondary | ICD-10-CM | POA: Insufficient documentation

## 2015-11-29 DIAGNOSIS — I10 Essential (primary) hypertension: Secondary | ICD-10-CM | POA: Insufficient documentation

## 2015-11-29 MED ORDER — ACETAMINOPHEN 500 MG PO TABS
1000.0000 mg | ORAL_TABLET | Freq: Once | ORAL | Status: AC
  Administered 2015-11-29: 1000 mg via ORAL
  Filled 2015-11-29: qty 2

## 2015-11-29 NOTE — ED Notes (Signed)
Declined W/C at D/C and was escorted to lobby by RN. 

## 2015-11-29 NOTE — Discharge Instructions (Signed)
Please take over the counter Tylenol arthritis as needed for your hip pain. Please call the clinic listed to schedule an appointment for follow-up. You can also follow-up with a primary care clinic of your choice if you would rather do so. Return to the ER for any new or worsening symptoms, any additional concerns.

## 2015-11-29 NOTE — ED Provider Notes (Signed)
CSN: [id number]     Arrival date & time 11/29/15  1020 History  By signing my name below, I, Lance Stone, attest that this documentation has been prepared under the direction and in the presence of Pearlie Oyster, PA-C Electronically Signed: Ladene Artist, ED Scribe 11/29/2015 at 12:06 PM.   Chief Complaint  Patient presents with  . chronic hip pain    The history is provided by the patient. No language interpreter was used.   HPI Comments: Lance Stone is a 50 y.o. male who presents to the Emergency Department complaining of unchanged,  intermittent bilateral hip pain for the past year. Pt states that he was evaluated at [facility] for the same in March 2016 and diagnosed with arthritis. He states that he was given a prescription for pain medication which he never got filled. Pt is unsure what medication he was prescribed. He has tried ibuprofen, Aleve, Advil, Goody's powder with mild relief. No local PCP.   Past Medical History  Diagnosis Date  . Hypertension    Past Surgical History  Procedure Laterality Date  . Tonsillectomy     No family history on file. Social History  Substance Use Topics  . Smoking status: Current Every Day Smoker    Types: Cigarettes  . Smokeless tobacco: None  . Alcohol Use: Yes     Comment: occasional    Review of Systems  Constitutional: Negative for fever.  Musculoskeletal: Positive for arthralgias.   Allergies  Review of patient's allergies indicates no known allergies.  Home Medications   Prior to Admission medications   Medication Sig Start Date End Date Taking? Authorizing Provider  ibuprofen (ADVIL,MOTRIN) 200 MG tablet Take 400 mg by mouth every 6 (six) hours as needed for pain.    Historical Provider, MD  naproxen (NAPROSYN) 500 MG tablet Take 1 tablet (500 mg total) by mouth 2 (two) times daily. 02/10/13   Marissa Sciacca, PA-C   BP 147/90 mmHg  Pulse 81  Temp(Src) 98.1 F (36.7 C) (Oral)  Resp 18  SpO2 100% Physical Exam   Constitutional: He is oriented to person, place, and time. He appears well-developed and well-nourished. No distress.  HENT:  Head: Normocephalic and atraumatic.  Eyes: Conjunctivae and EOM are normal.  Neck: Neck supple. No tracheal deviation present.  Cardiovascular: Normal rate, regular rhythm and normal heart sounds.   Pulmonary/Chest: Effort normal and breath sounds normal. No respiratory distress.  Musculoskeletal:       Left hip: He exhibits no swelling, no crepitus and no deformity.       Legs: Full range of motion. No overlying skin changes. Mild tenderness to palpation of bilateral hips as depicted in image. 5 of 5 muscle strength bilaterally. Bilateral lower extremities neurovascularly intact.  Neurological: He is alert and oriented to person, place, and time.  Skin: Skin is warm and dry.  Psychiatric: He has a normal mood and affect. His behavior is normal.  Nursing note and vitals reviewed.  ED Course  Procedures (including critical care time) DIAGNOSTIC STUDIES: Oxygen Saturation is 100% on RA, normal by my interpretation.    COORDINATION OF CARE: 11:39 AM-Discussed treatment plan which includes Tylenol with pt at bedside and pt agreed to plan.   Labs Review Labs Reviewed - No data to display  Imaging Review No results found.   EKG Interpretation None      MDM   Final diagnoses:  Arthritis   [name] presents to the Emergency department for bilateral hip  pain that is unchanged over the past year. He was diagnosed with arthritis and given a prescription, however he does not know what this prescription was never got it filled. Chart review does not show any prescriptions for arthritis. On exam, he has full range of motion, full strength in lower extremities, and neurovascularly intact. There is tenderness to palpation of bilateral hips, but symptoms appear to be consistent with arthritis. I informed him that I do not believe imaging was necessary, as it  will likely only show [place] the signs of arthritis which we are ready are aware of. He has had no acute changes to his symptoms or any acute injury. He is agreeable to this. I recommended over-the-counter Tylenol arthritis for symptoms and to follow-up with the [facility] for long-term management of symptoms. Patient was agreeable to this treatment plan. Home care instructions were discussed and all questions were answered.  I personally performed the services described in this documentation, which was scribed in my presence. The recorded information has been reviewed and is accurate.    [facility] Shequila Neglia, PA-C 11/29/15 1216  Charlesetta Shanks, MD 12/02/15 [phone]

## 2015-11-29 NOTE — ED Notes (Signed)
Patient here with ongoing bilateral hip pain that he relates to his diagnosis of arthritis, has lost his medications

## 2016-03-09 ENCOUNTER — Emergency Department (HOSPITAL_COMMUNITY)
Admission: EM | Admit: 2016-03-09 | Discharge: 2016-03-09 | Disposition: A | Discharge status: Home / Self Care | Payer: Self-pay | Attending: Emergency Medicine | Admitting: Emergency Medicine

## 2016-03-09 ENCOUNTER — Encounter (HOSPITAL_COMMUNITY): Payer: Self-pay | Admitting: Family Medicine

## 2016-03-09 ENCOUNTER — Emergency Department (HOSPITAL_COMMUNITY): Payer: Self-pay

## 2016-03-09 DIAGNOSIS — M25552 Pain in left hip: Secondary | ICD-10-CM | POA: Insufficient documentation

## 2016-03-09 DIAGNOSIS — M25559 Pain in unspecified hip: Secondary | ICD-10-CM

## 2016-03-09 DIAGNOSIS — I10 Essential (primary) hypertension: Secondary | ICD-10-CM | POA: Insufficient documentation

## 2016-03-09 DIAGNOSIS — M25551 Pain in right hip: Secondary | ICD-10-CM | POA: Insufficient documentation

## 2016-03-09 DIAGNOSIS — F17210 Nicotine dependence, cigarettes, uncomplicated: Secondary | ICD-10-CM | POA: Insufficient documentation

## 2016-03-09 DIAGNOSIS — Z79899 Other long term (current) drug therapy: Secondary | ICD-10-CM | POA: Insufficient documentation

## 2016-03-09 NOTE — ED Notes (Signed)
Declined W/C at D/C and was escorted to lobby by RN. 

## 2016-03-09 NOTE — ED Triage Notes (Signed)
Pt here for bilateral hip pain and lower back pain. sts hurts when he bends. sts this pain has been there for years but worse over the last 2 days. Denies injury.

## 2016-03-09 NOTE — ED Provider Notes (Signed)
[facility] DEPT Provider Note   CSN: [id number] Arrival date & time: 03/09/16  1205  First Provider Contact: 03/09/2016 12:25 PM  : By signing my name below, I, Jasmyn B. Alexander, attest that this documentation has been prepared under the direction and in the presence of Montine Circle, PA-C. Electronically Signed: Tedra Coupe. Sheppard Coil, ED Scribe. 03/09/16. 12:30 PM.  History   Chief Complaint Chief Complaint  Patient presents with  . Hip Pain    HPI HPI Comments: Lance Stone is a 50 y.o. male with PMhx of HTN who presents to the Emergency Department complaining of gradual worsening, constant, "agitating", radiating, bilateral hip pain to lower back x 2 days. Pt reports that pain has been present for 2 years but has gotten worse. He states that when he walks "it feels like my legs want to give out on me." Pt has taken Goody powder for pain with mild relief, but pain returned shortly after. Pain is exacerbated when he stretches and when he bends forward towards the ground. Denies any bowel or urinary incontinence.  The history is provided by the patient. No language interpreter was used.    Past Medical History:  Diagnosis Date  . Hypertension     There are no active problems to display for this patient.  Past Surgical History:  Procedure Laterality Date  . TONSILLECTOMY      Home Medications    Prior to Admission medications   Medication Sig Start Date End Date Taking? Authorizing Provider  ibuprofen (ADVIL,MOTRIN) 200 MG tablet Take 400 mg by mouth every 6 (six) hours as needed for pain.    Historical Provider, MD  naproxen (NAPROSYN) 500 MG tablet Take 1 tablet (500 mg total) by mouth 2 (two) times daily. 02/10/13   Marissa Sciacca, PA-C    Family History History reviewed. No pertinent family history.  Social History Social History  Substance Use Topics  . Smoking status: Current Every Day Smoker    Types: Cigarettes  . Smokeless tobacco: Never Used  .  Alcohol use Yes     Comment: occasional    Allergies   Review of patient's allergies indicates no known allergies.   Review of Systems Review of Systems  Musculoskeletal: Positive for arthralgias and back pain.  Neurological: Positive for weakness.  All other systems reviewed and are negative.  Physical Exam Updated Vital Signs BP 120/79   Pulse 88   Temp 98.4 F (36.9 C)   Resp 18   SpO2 93%   Physical Exam Physical Exam  Constitutional: Pt appears well-developed and well-nourished. No distress.  HENT:  Head: Normocephalic and atraumatic.  Eyes: Conjunctivae are normal.  Neck: Normal range of motion.  Cardiovascular: Normal rate, regular rhythm and intact distal pulses.   Capillary refill < 3 sec  Pulmonary/Chest: Effort normal and breath sounds normal.  Musculoskeletal: Pt exhibits NO tenderness. Pt exhibits no edema.  ROM: Bilateral hips 5/5  Neurological: Pt  is alert. Coordination normal.  Sensation Bilateral hips 5/5 Strength Bilateral hips 5/5  Skin: Skin is warm and dry. Pt is not diaphoretic.  No tenting of the skin  Psychiatric: Pt has a normal mood and affect.  Nursing note and vitals reviewed.   ED Treatments / Results  DIAGNOSTIC STUDIES: Oxygen Saturation is 93% on RA, adequate by my interpretation.    COORDINATION OF CARE: 12:28 PM-Discussed treatment plan which includes X-ray of Pelvis with pt at bedside and pt agreed to plan.   Radiology Dg Pelvis 1-2 Views  Result Date: 03/09/2016 CLINICAL DATA:  50 year old male with bilateral hip pain. No injury. Initial encounter. EXAM: PELVIS - 1-2 VIEW COMPARISON:  None. FINDINGS: Bilateral hip joint degenerative changes with joint space narrowing and osteophyte greater on the left and subchondral cystic changes greater on the right. 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 Frogleg views not obtained. No obvious femoral head avascular necrosis. IMPRESSION: Bilateral hip joint degenerative changes with joint  space narrowing and osteophyte greater on the left and subchondral cystic changes greater on the right. 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 Electronically Signed   By: Genia Del M.D.   On: 03/09/2016 13:35    Procedures Procedures (including critical care time)  Initial Impression / Assessment and Plan / ED Course  I have reviewed the triage vital signs and the nursing notes.  Pertinent labs & imaging results that were available during my care of the patient were reviewed by me and considered in my medical decision making (see chart for details).  Clinical Course    Patient with chronic bilateral hip pain for years.  Gradually worsening.  No fever.  Able to ambulate.  No new trauma.  Plain films as above.  Recommend ortho follow-up.  Final Clinical Impressions(s) / ED Diagnoses   Final diagnoses:  Hip pain    New Prescriptions New Prescriptions   No medications on file   I personally performed the services described in this documentation, which was scribed in my presence. The recorded information has been reviewed and is accurate.       Montine Circle, PA-C 03/09/16 1353    Duffy Bruce, MD 03/09/16 2108

## 2016-04-27 ENCOUNTER — Encounter (HOSPITAL_COMMUNITY): Payer: Self-pay | Admitting: *Deleted

## 2016-04-27 ENCOUNTER — Emergency Department (HOSPITAL_COMMUNITY)
Admission: EM | Admit: 2016-04-27 | Discharge: 2016-04-28 | Disposition: A | Discharge status: Home / Self Care | Payer: PRIVATE HEALTH INSURANCE | Attending: Emergency Medicine | Admitting: Emergency Medicine

## 2016-04-27 DIAGNOSIS — Y939 Activity, unspecified: Secondary | ICD-10-CM | POA: Insufficient documentation

## 2016-04-27 DIAGNOSIS — L02211 Cutaneous abscess of abdominal wall: Secondary | ICD-10-CM

## 2016-04-27 DIAGNOSIS — L03311 Cellulitis of abdominal wall: Secondary | ICD-10-CM | POA: Insufficient documentation

## 2016-04-27 DIAGNOSIS — Y999 Unspecified external cause status: Secondary | ICD-10-CM | POA: Insufficient documentation

## 2016-04-27 DIAGNOSIS — X58XXXA Exposure to other specified factors, initial encounter: Secondary | ICD-10-CM | POA: Insufficient documentation

## 2016-04-27 DIAGNOSIS — S39012A Strain of muscle, fascia and tendon of lower back, initial encounter: Secondary | ICD-10-CM | POA: Insufficient documentation

## 2016-04-27 DIAGNOSIS — F17210 Nicotine dependence, cigarettes, uncomplicated: Secondary | ICD-10-CM | POA: Insufficient documentation

## 2016-04-27 DIAGNOSIS — Y929 Unspecified place or not applicable: Secondary | ICD-10-CM | POA: Insufficient documentation

## 2016-04-27 LAB — COMPREHENSIVE METABOLIC PANEL
ALBUMIN: 3.4 g/dL — AB (ref 3.5–5.0)
ALK PHOS: 56 U/L (ref 38–126)
ALT: 33 U/L (ref 17–63)
AST: 33 U/L (ref 15–41)
Anion gap: 9 (ref 5–15)
BUN: 9 mg/dL (ref 6–20)
CALCIUM: 8.7 mg/dL — AB (ref 8.9–10.3)
CO2: 26 mmol/L (ref 22–32)
CREATININE: 0.98 mg/dL (ref 0.61–1.24)
Chloride: 105 mmol/L (ref 101–111)
GFR calc non Af Amer: >60 mL/min (ref >60)
GLUCOSE: 85 mg/dL (ref 65–99)
Potassium: 4.1 mmol/L (ref 3.5–5.1)
SODIUM: 140 mmol/L (ref 135–145)
Total Bilirubin: 1.1 mg/dL (ref 0.3–1.2)
Total Protein: 5.8 g/dL — ABNORMAL LOW (ref 6.5–8.1)

## 2016-04-27 LAB — CBC
HCT: 42.8 % (ref 39.0–52.0)
Hemoglobin: 15 g/dL (ref 13.0–17.0)
MCH: 29.7 pg (ref 26.0–34.0)
MCHC: 35 g/dL (ref 30.0–36.0)
MCV: 84.8 fL (ref 78.0–100.0)
PLATELETS: 305 10*3/uL (ref 150–400)
RBC: 5.05 MIL/uL (ref 4.22–5.81)
RDW: 14.1 % (ref 11.5–15.5)
WBC: 19.1 10*3/uL — ABNORMAL HIGH (ref 4.0–10.5)

## 2016-04-27 NOTE — ED Notes (Signed)
No answer when called for room 

## 2016-04-27 NOTE — ED Triage Notes (Signed)
Pt reports right side back pain for several days, increases with movement and breathing. Denies any urinary symptoms. Denies pain radiating into abd or down his legs. Pt requesting that his kidneys be checked. Also has abscess to right side abd, redness noted.

## 2016-04-28 LAB — URINALYSIS, ROUTINE W REFLEX MICROSCOPIC
GLUCOSE, UA: NEGATIVE mg/dL
HGB URINE DIPSTICK: NEGATIVE
KETONES UR: 15 mg/dL — AB
Leukocytes, UA: NEGATIVE
Nitrite: NEGATIVE
PH: 6 (ref 5.0–8.0)
Protein, ur: NEGATIVE mg/dL
Specific Gravity, Urine: 1.028 (ref 1.005–1.030)

## 2016-04-28 LAB — I-STAT CG4 LACTIC ACID, ED: Lactic Acid, Venous: 1.47 mmol/L (ref 0.5–1.9)

## 2016-04-28 LAB — I-STAT TROPONIN, ED: TROPONIN I, POC: 0 ng/mL (ref 0.00–0.08)

## 2016-04-28 MED ORDER — SULFAMETHOXAZOLE-TRIMETHOPRIM 800-160 MG PO TABS
1.0000 | ORAL_TABLET | Freq: Once | ORAL | Status: AC
  Administered 2016-04-28: 1 via ORAL
  Filled 2016-04-28: qty 1

## 2016-04-28 MED ORDER — OXYCODONE-ACETAMINOPHEN 5-325 MG PO TABS: 1.0000 | ORAL_TABLET | Freq: Four times a day (QID) | ORAL | 0 refills | Status: DC | PRN

## 2016-04-28 MED ORDER — IBUPROFEN 800 MG PO TABS: 800.0000 mg | ORAL_TABLET | Freq: Three times a day (TID) | ORAL | 0 refills | Status: DC | PRN

## 2016-04-28 MED ORDER — SULFAMETHOXAZOLE-TRIMETHOPRIM 800-160 MG PO TABS: 1.0000 | ORAL_TABLET | Freq: Two times a day (BID) | ORAL | 0 refills | Status: AC

## 2016-04-28 MED ORDER — LIDOCAINE-EPINEPHRINE (PF) 2 %-1:200000 IJ SOLN
20.0000 mL | Freq: Once | INTRAMUSCULAR | Status: AC
  Administered 2016-04-28: 20 mL via INTRADERMAL
  Filled 2016-04-28: qty 20

## 2016-04-28 MED ORDER — ONDANSETRON 4 MG PO TBDP
4.0000 mg | ORAL_TABLET | Freq: Once | ORAL | Status: AC
  Administered 2016-04-28: 4 mg via ORAL
  Filled 2016-04-28: qty 1

## 2016-04-28 MED ORDER — OXYCODONE-ACETAMINOPHEN 5-325 MG PO TABS
2.0000 | ORAL_TABLET | Freq: Once | ORAL | Status: AC
  Administered 2016-04-28: 2 via ORAL
  Filled 2016-04-28: qty 2

## 2016-04-28 NOTE — ED Provider Notes (Addendum)
By signing my name below, I, Reola Mosher, attest that this documentation has been prepared under the direction and in the presence of [place], DO. Electronically Signed: Reola Mosher, ED Scribe. 04/28/16. 3:16 AM.  TIME SEEN: 2:57 AM  CHIEF COMPLAINT:  Chief Complaint  Patient presents with  . Flank Pain  . Abscess   HPI Comments: Lance Stone is a 50 y.o. male with a PMHx of HTN and ostearthritis of the right hip, who presents Stone the Emergency Department complaining of gradual onset, gradually worsening, constant right-sided lower back pain onset ~2 weeks ago. No recent trauma, falls, or heavy lifting Stone precipitate his current pain. His pain is exacerbated with movement and bending. No alleviating factors noted. No hx of back surgeries or epidural injections. Denies any new numbness, tingling, focal weakness, or any other associated symptoms. No urinary retention, bowel or bladder incontinence.  Pt additionally is c/o a gradual onset, gradually worsening area of pain, redness, and swelling Stone his right-sided abdomen x ~2 days. Pt reports associated fever (febrile in the ED at 100) and intermittent episodes of diarrhea secondary Stone this issue. He states that he initially thought that a spider may have bit him, but denies seeing any spider or insect Stone the area prior Stone onset of this issue. NKDA. Denies emesis, nausea, penile discharge, scrotal swelling, hematuria, dysuria, or any other associated symptoms.    ROS: See HPI Constitutional: fever  Eyes: no drainage  ENT: no runny nose   Cardiovascular:  no chest pain  Resp: no SOB  GI: no vomiting GU: no dysuria Integumentary: no rash  Allergy: no hives  Musculoskeletal: no leg swelling  Neurological: no slurred speech ROS otherwise negative  PAST MEDICAL HISTORY/PAST SURGICAL HISTORY:  Past Medical History:  Diagnosis Date  . Hypertension     MEDICATIONS:  Prior Stone Admission medications   Medication  Sig Start Date End Date Taking? Authorizing Provider  naproxen (NAPROSYN) 500 MG tablet Take 1 tablet (500 mg total) by mouth 2 (two) times daily. Patient not taking: Reported on 04/28/2016 02/10/13   Jamse Mead, PA-C    ALLERGIES:  No Known Allergies  SOCIAL HISTORY:  Social History  Substance Use Topics  . Smoking status: Current Every Day Smoker    Types: Cigarettes  . Smokeless tobacco: Never Used  . Alcohol use Yes     Comment: occasional    FAMILY HISTORY: History reviewed. No pertinent family history.  EXAM: BP 132/71 (BP Location: Right Arm)   Pulse 86   Temp 98.9 F (37.2 C) (Oral)   Resp 18   SpO2 99%  CONSTITUTIONAL: Alert and oriented and responds appropriately Stone questions. Well-appearing; well-nourished HEAD: Normocephalic EYES: Conjunctivae clear, PERRL ENT: normal nose; no rhinorrhea; moist mucous membranes NECK: Supple, no meningismus, no LAD  CARD: RRR; S1 and S2 appreciated; no murmurs, no clicks, no rubs, no gallops RESP: Normal chest excursion without splinting or tachypnea; breath sounds clear and equal bilaterally; no wheezes, no rhonchi, no rales, no hypoxia or respiratory distress, speaking full sentences ABD/GI: Normal bowel sounds; non-distended; soft, no rebound, no guarding, no peritoneal signs. 10cm x 8cm area of erythema and induration Stone right-sided abdomen. Central fluctuant area with pinpoint opening with purulent drainage. TTP around this area.  BACK:  The back appears normal and is tender Stone palpation, there is no CVA tenderness. TTP over the right lumbar paraspinal musculature, no midline spinal TTP, step-off or deformity.  EXT: Normal ROM in all  joints; non-tender Stone palpation; no edema; normal capillary refill; no cyanosis, no calf tenderness or swelling    SKIN: Normal color for age and race; warm; no rash NEURO: Moves all extremities equally, sensation Stone light touch intact diffusely, cranial nerves II through XII intact, Normal  gait PSYCH: The patient's mood and manner are appropriate. Grooming and personal hygiene are appropriate.  MEDICAL DECISION MAKING: Patient here with right-sided back pain. Suspect muscle strain, spasm. Pain is reproducible with palpation. Doubt kidney stone. No midline spinal tenderness or neurologic deficit. I do not feel he needs imaging of his back. No injury recently Stone suggest fracture. No red flag symptoms Stone suggest cauda equina, spinal stenosis, epidural abscess or hematoma, discitis or transverse myelitis, osteomyelitis.  Has an abscess and cellulitis Stone his right abdominal wall. He will need incision and drainage. Will start him on Bactrim. He does have a leukocytosis which appears slightly worse than his chronically mildly elevated white count. We'll check a lactate. He was hemodynamically stable.  ED PROGRESS: I have I&D patient's abscess. We'll have him return in 72 - 72 hours for wound recheck. Have outlined area of erythema with a tissue marker. Packing has been placed. Lactate is normal. He accidentally had a troponin drawn. He is not having any chest pain or shortness of breath. Troponin is negative.  We'll give him outpatient PCP follow-up information. Will discharge on Bactrim. We'll discharge with pain medication.   At this time, I do not feel there is any life-threatening condition present. I have reviewed and discussed all results (EKG, imaging, lab, urine as appropriate), exam findings with patient/family. I have reviewed nursing notes and appropriate previous records.  I feel the patient is safe Stone be discharged home without further emergent workup and can continue workup as an outpatient as needed. Discussed usual and customary return precautions. Patient/family verbalize understanding and are comfortable with this plan.  Outpatient follow-up has been provided. All questions have been answered.   -------------------PROCEDURES--------------------   INCISION AND  DRAINAGE Performed by: Nyra Jabs Consent: Verbal consent obtained. Risks and benefits: risks, benefits and alternatives were discussed Type: abscess  Body area: Right abdomen  Anesthesia: local infiltration  Incision was made with a scalpel.  Local anesthetic: lidocaine 2 % with epinephrine  Anesthetic total: 7 ml  Complexity: complex Blunt dissection Stone break up loculations  Drainage: purulent  Drainage amount: Small   Packing material: 1/4 in iodoform gauze  Patient tolerance: Patient tolerated the procedure well with no immediate complications.     I personally performed the services described in the above documentation, which was scribed in my presence. The recorded information above has been reviewed and is accurate.     [place], DO 04/28/16 [place], DO 04/28/16 [id number]

## 2016-04-28 NOTE — ED Notes (Signed)
Patient left at this time with all belongings. 

## 2016-04-28 NOTE — ED Notes (Signed)
MD at bedside. 

## 2016-04-28 NOTE — Discharge Instructions (Signed)
To find a primary care or specialty doctor please call [phone] or [phone] to access "[facility] Find a Doctor Service." ° °You may also go on the [facility] website at [email or web address] ° °There are also multiple [facility], [facility] and [facility] practices throughout the [place] that are frequently accepting new patients. You may find a clinic that is close to your home and contact them. ° °[facility] -  °[address] °[place] [address] °[phone] ° °[facility] in [place] (also locations in [place] and [place]) -  °[address] °[place] [address] °[phone] ° °[facility] -  °[address] °[place] [address] °[phone] ° ° °

## 2016-04-28 NOTE — ED Notes (Signed)
Pt upset regarding wait time, explained that he had been discharged after calling for room numerous times. Pt back in process.

## 2016-04-30 LAB — AEROBIC CULTURE  (SUPERFICIAL SPECIMEN)

## 2016-04-30 LAB — AEROBIC CULTURE W GRAM STAIN (SUPERFICIAL SPECIMEN)

## 2016-05-01 ENCOUNTER — Telehealth (HOSPITAL_BASED_OUTPATIENT_CLINIC_OR_DEPARTMENT_OTHER): Payer: Self-pay | Admitting: Emergency Medicine

## 2016-05-01 NOTE — Telephone Encounter (Signed)
Post ED Visit - Positive Culture Follow-up  Culture report reviewed by antimicrobial stewardship pharmacist:  []  Elenor Quinones, Pharm.D. []  Heide Guile, Pharm.D., BCPS []  Parks Neptune, Pharm.D. []  Alycia Rossetti, Pharm.D., BCPS []  [place], Pharm.D., BCPS, AAHIVP []  Legrand Como, Pharm.D., BCPS, AAHIVP []  Milus Glazier, Pharm.D. []  Stephens November, [place].D. Dimitri Ped PharmD  Positive wound culture Treated with bactrim DS, organism sensitive to the same and no further patient follow-up is required at this time.  Hazle Nordmann 05/01/2016, 11:22 AM

## 2016-11-06 ENCOUNTER — Emergency Department (HOSPITAL_COMMUNITY)
Admission: EM | Admit: 2016-11-06 | Discharge: 2016-11-06 | Disposition: A | Discharge status: Home / Self Care | Payer: PRIVATE HEALTH INSURANCE | Attending: Emergency Medicine | Admitting: Emergency Medicine

## 2016-11-06 ENCOUNTER — Encounter (HOSPITAL_COMMUNITY): Payer: Self-pay | Admitting: Emergency Medicine

## 2016-11-06 DIAGNOSIS — K0889 Other specified disorders of teeth and supporting structures: Secondary | ICD-10-CM | POA: Insufficient documentation

## 2016-11-06 DIAGNOSIS — I10 Essential (primary) hypertension: Secondary | ICD-10-CM | POA: Insufficient documentation

## 2016-11-06 DIAGNOSIS — K089 Disorder of teeth and supporting structures, unspecified: Secondary | ICD-10-CM

## 2016-11-06 DIAGNOSIS — G8929 Other chronic pain: Secondary | ICD-10-CM | POA: Insufficient documentation

## 2016-11-06 DIAGNOSIS — F17210 Nicotine dependence, cigarettes, uncomplicated: Secondary | ICD-10-CM | POA: Insufficient documentation

## 2016-11-06 DIAGNOSIS — K047 Periapical abscess without sinus: Secondary | ICD-10-CM

## 2016-11-06 MED ORDER — OXYCODONE-ACETAMINOPHEN 5-325 MG PO TABS: 1.0000 | ORAL_TABLET | ORAL | 0 refills | Status: DC | PRN

## 2016-11-06 MED ORDER — CLINDAMYCIN HCL 150 MG PO CAPS: 450.0000 mg | ORAL_CAPSULE | Freq: Three times a day (TID) | ORAL | 0 refills | Status: DC

## 2016-11-06 MED ORDER — BUPIVACAINE-EPINEPHRINE (PF) 0.5% -1:200000 IJ SOLN
1.8000 mL | Freq: Once | INTRAMUSCULAR | Status: AC
  Administered 2016-11-06: 1.8 mL
  Filled 2016-11-06: qty 1.8

## 2016-11-06 MED ORDER — OXYCODONE-ACETAMINOPHEN 5-325 MG PO TABS
2.0000 | ORAL_TABLET | Freq: Once | ORAL | Status: AC
  Administered 2016-11-06: 2 via ORAL
  Filled 2016-11-06: qty 2

## 2016-11-06 NOTE — ED Triage Notes (Signed)
c/o pain in right side of mouth.  Swollen area noted to roof of mouth.  Started 5 days ago.

## 2016-11-06 NOTE — Discharge Instructions (Signed)
1. Medications: vicodin, clindamycin, usual home medications 2. Treatment: rest, drink plenty of fluids, take medications as prescribed 3. Follow Up: Please followup with dentistry within 1 week for discussion of your diagnoses and further evaluation after today's visit; if you do not have a primary care doctor use the resource guide provided to find one; Return to the ER for high fevers, difficulty breathing, difficulty swallowing or other concerning symptoms

## 2016-11-06 NOTE — ED Notes (Signed)
Pt understood dc material. NAD noted. Scripts given at dc 

## 2016-11-06 NOTE — ED Provider Notes (Signed)
[facility] DEPT Provider Note   CSN: [id number] Arrival date & time: 11/06/16  0222     History   Chief Complaint Chief Complaint  Patient presents with  . Dental Pain    HPI Lance Stone is a 51 y.o. male with a hx of HTN presents to the Emergency Department complaining of gradual, persistent, progressively worsening Dental abscess onset 3 days ago. Associated symptoms include denies headache, chills, pain at the site. Eating and drinking make it worse. No treatments prior to arrival. Nothing makes his symptoms better.  Patient denies difficulty swallowing, voice change. Patient has not seen a dentist in a very long time.     The history is provided by the patient and medical records. No language interpreter was used.    Past Medical History:  Diagnosis Date  . Hypertension     There are no active problems to display for this patient.   Past Surgical History:  Procedure Laterality Date  . TONSILLECTOMY         Home Medications    Prior to Admission medications   Medication Sig Start Date End Date Taking? Authorizing Provider  clindamycin (CLEOCIN) 150 MG capsule Take 3 capsules (450 mg total) by mouth 3 (three) times daily. 11/06/16   Linea Calles, PA-C  ibuprofen (ADVIL,MOTRIN) 800 MG tablet Take 1 tablet (800 mg total) by mouth every 8 (eight) hours as needed for mild pain. 04/28/16   Kristen N Ward, DO  oxyCODONE-acetaminophen (PERCOCET) 5-325 MG tablet Take 1-2 tablets by mouth every 4 (four) hours as needed. 11/06/16   Jarrett Soho Veralyn Lopp, PA-C    Family History No family history on file.  Social History Social History  Substance Use Topics  . Smoking status: Current Every Day Smoker    Types: Cigarettes  . Smokeless tobacco: Never Used  . Alcohol use Yes     Comment: occasional     Allergies   Patient has no known allergies.   Review of Systems Review of Systems  HENT: Positive for dental problem.   All other systems reviewed and are  negative.    Physical Exam Updated Vital Signs BP (!) 165/87 (BP Location: Left Arm)   Pulse 73   Temp 98.4 F (36.9 C) (Oral)   Resp 18   Ht 5\' 10"  (1.778 m)   Wt 86.2 kg   SpO2 97%   BMI 27.26 kg/m   Physical Exam  Constitutional: He appears well-developed and well-nourished.  HENT:  Head: Normocephalic.  Right Ear: Tympanic membrane, external ear and ear canal normal.  Left Ear: Tympanic membrane, external ear and ear canal normal.  Nose: Nose normal. Right sinus exhibits no maxillary sinus tenderness and no frontal sinus tenderness. Left sinus exhibits no maxillary sinus tenderness and no frontal sinus tenderness.  Mouth/Throat: Uvula is midline, oropharynx is clear and moist and mucous membranes are normal. No oral lesions. Abnormal dentition. Dental caries present. No uvula swelling or lacerations. No oropharyngeal exudate, posterior oropharyngeal edema, posterior oropharyngeal erythema or tonsillar abscesses.  Large abscess surrounding teeth 1 through 4 and extending into the hard palate with palpable fluctuance. No fluctuance or induration to the buccal mucosa or floor of the mouth  Eyes: Conjunctivae are normal. Pupils are equal, round, and reactive to light. Right eye exhibits no discharge. Left eye exhibits no discharge.  Neck: Normal range of motion. Neck supple.  No stridor Handling secretions without difficulty No nuchal rigidity No cervical lymphadenopathy  Cardiovascular: Normal rate, regular rhythm and normal heart  sounds.   Pulmonary/Chest: Effort normal. No respiratory distress.  Equal chest rise  Abdominal: Soft. Bowel sounds are normal. He exhibits no distension. There is no tenderness.  Lymphadenopathy:    He has no cervical adenopathy.  Neurological: He is alert.  Skin: Skin is warm and dry.  Psychiatric: He has a normal mood and affect.  Nursing note and vitals reviewed.    ED Treatments / Results   Procedures .Marland KitchenIncision and  Drainage Date/Time: 11/06/2016 6:16 AM Performed by: Abigail Butts Authorized by: Abigail Butts   Consent:    Consent obtained:  Verbal   Consent given by:  Patient   Risks discussed:  Bleeding, incomplete drainage, infection and pain   Alternatives discussed:  Alternative treatment (abx only) Location:    Type:  Abscess   Location:  Mouth   Mouth location:  Palate Anesthesia (see MAR for exact dosages):    Anesthesia method:  Local infiltration   Local anesthetic:  Bupivacaine 0.5% WITH epi (1.75ml) Procedure type:    Complexity:  Complex Procedure details:    Needle aspiration: yes     Needle size:  18 G   Incision types:  Single straight   Scalpel blade:  11   Wound management:  Probed and deloculated and irrigated with saline   Drainage:  Bloody and purulent   Drainage amount:  Moderate   Wound treatment:  Wound left open   Packing materials:  None Post-procedure details:    Patient tolerance of procedure:  Tolerated well, no immediate complications   (including critical care time)  Medications Ordered in ED Medications  oxyCODONE-acetaminophen (PERCOCET/ROXICET) 5-325 MG per tablet 2 tablet (2 tablets Oral Given 11/06/16 0451)  bupivacaine-epinephrine (MARCAINE W/ EPI) 0.5% -1:200000 injection 1.8 mL (1.8 mLs Infiltration Given 11/06/16 0520)     Initial Impression / Assessment and Plan / ED Course  I have reviewed the triage vital signs and the nursing notes.  Pertinent labs & imaging results that were available during my care of the patient were reviewed by me and considered in my medical decision making (see chart for details).     Patient with toothache.  Large abscess noted to the hard palate, clearly extending from the periapical space of teeth 1-4.  I&D with large amount of purulent drainage.  Exam unconcerning for Ludwig's angina or spread of infection.  Will treat with Clindamycin and pain medicine.  Urged patient to follow-up with dentist.     Patient noted to be hypertensive in the emergency department.  No signs of hypertensive urgency.  Discussed with patient the need for close follow-up and management by their primary care physician.     Final Clinical Impressions(s) / ED Diagnoses   Final diagnoses:  Chronic dental pain  Dental abscess  Essential hypertension    New Prescriptions New Prescriptions   CLINDAMYCIN (CLEOCIN) 150 MG CAPSULE    Take 3 capsules (450 mg total) by mouth 3 (three) times daily.   OXYCODONE-ACETAMINOPHEN (PERCOCET) 5-325 MG TABLET    Take 1-2 tablets by mouth every 4 (four) hours as needed.     Jarrett Soho Iley Breeden, PA-C 11/06/16 [place], MD 11/06/16 [phone]

## 2016-11-25 ENCOUNTER — Emergency Department (HOSPITAL_COMMUNITY)
Admission: EM | Admit: 2016-11-25 | Discharge: 2016-11-25 | Disposition: A | Discharge status: Home / Self Care | Payer: PRIVATE HEALTH INSURANCE | Attending: Emergency Medicine | Admitting: Emergency Medicine

## 2016-11-25 ENCOUNTER — Encounter (HOSPITAL_COMMUNITY): Payer: Self-pay | Admitting: Emergency Medicine

## 2016-11-25 DIAGNOSIS — I10 Essential (primary) hypertension: Secondary | ICD-10-CM | POA: Insufficient documentation

## 2016-11-25 DIAGNOSIS — K047 Periapical abscess without sinus: Secondary | ICD-10-CM | POA: Insufficient documentation

## 2016-11-25 DIAGNOSIS — K029 Dental caries, unspecified: Secondary | ICD-10-CM

## 2016-11-25 DIAGNOSIS — Z79899 Other long term (current) drug therapy: Secondary | ICD-10-CM | POA: Insufficient documentation

## 2016-11-25 DIAGNOSIS — F17210 Nicotine dependence, cigarettes, uncomplicated: Secondary | ICD-10-CM | POA: Insufficient documentation

## 2016-11-25 DIAGNOSIS — K0889 Other specified disorders of teeth and supporting structures: Secondary | ICD-10-CM

## 2016-11-25 MED ORDER — BUPIVACAINE HCL (PF) 0.25 % IJ SOLN
10.0000 mL | Freq: Once | INTRAMUSCULAR | Status: AC
  Administered 2016-11-25: 10 mL
  Filled 2016-11-25: qty 30

## 2016-11-25 MED ORDER — IBUPROFEN 600 MG PO TABS: 600.0000 mg | ORAL_TABLET | Freq: Four times a day (QID) | ORAL | 0 refills | Status: DC | PRN

## 2016-11-25 MED ORDER — OXYCODONE-ACETAMINOPHEN 5-325 MG PO TABS
1.0000 | ORAL_TABLET | Freq: Once | ORAL | Status: AC
  Administered 2016-11-25: 1 via ORAL
  Filled 2016-11-25: qty 1

## 2016-11-25 MED ORDER — BUPIVACAINE HCL 0.25 % IJ SOLN: 10.0000 mL | Freq: Once | INTRAMUSCULAR | Status: DC

## 2016-11-25 MED ORDER — AMOXICILLIN-POT CLAVULANATE 875-125 MG PO TABS: 1.0000 | ORAL_TABLET | Freq: Two times a day (BID) | ORAL | 0 refills | Status: DC

## 2016-11-25 NOTE — Discharge Instructions (Signed)
Please read and follow all provided instructions.  Your diagnoses today include:  1. Pain, dental   2. Dental caries   3. Dental abscess     Tests performed today include: Vital signs. See below for your results today.   Medications prescribed:  Take as prescribed   Home care instructions:  Follow any educational materials contained in this packet.  Follow-up instructions: Please follow-up with a dentist for further evaluation of symptoms and treatment   Return instructions:  Please return to the Emergency Department if you do not get better, if you get worse, or new symptoms OR  - Fever (temperature greater than 101.37F)  - Bleeding that does not stop with holding pressure to the area    -Severe pain (please note that you may be more sore the day after your accident)  - Chest Pain  - Difficulty breathing  - Severe nausea or vomiting  - Inability to tolerate food and liquids  - Passing out  - Skin becoming red around your wounds  - Change in mental status (confusion or lethargy)  - New numbness or weakness    Please return if you have any other emergent concerns.  Additional Information:  Your vital signs today were: BP 135/89 (BP Location: Left Arm)    Pulse (!) 116    Temp 98.2 F (36.8 C) (Oral)    Resp 18    SpO2 99%  If your blood pressure (BP) was elevated above 135/85 this visit, please have this repeated by your doctor within one month. ---------------

## 2016-11-25 NOTE — ED Provider Notes (Signed)
[facility] DEPT Provider Note   CSN: [id number] Arrival date & time: 11/25/16  2008   By signing my name below, I, Neta Mends, attest that this documentation has been prepared under the direction and in the presence of Shary Decamp, PA-C. Electronically Signed: Neta Mends, ED Scribe. 11/25/2016. 9:29 PM.   History   Chief Complaint Chief Complaint  Patient presents with  . Dental Pain    The history is provided by the patient. No language interpreter was used.   HPI Comments:  Lance Stone is a 51 y.o. male who presents to the Emergency Department complaining of worsening right sided dental pain x 3 days. Notes swelling on roof of mouth. No fevers. Pt was seen 3 weeks ago for the same, and recently finished his course of antibiotics. He is able to tolerate secretions. Pt does not have a dentist at this time. No alleviating factors noted. Pt denies fever, trouble swallowing. Rates pain 10/10. Aching sensation. No other symptoms noted.   Past Medical History:  Diagnosis Date  . Hypertension     There are no active problems to display for this patient.   Past Surgical History:  Procedure Laterality Date  . TONSILLECTOMY         Home Medications    Prior to Admission medications   Medication Sig Start Date End Date Taking? Authorizing Provider  clindamycin (CLEOCIN) 150 MG capsule Take 3 capsules (450 mg total) by mouth 3 (three) times daily. 11/06/16   Hannah Muthersbaugh, PA-C  ibuprofen (ADVIL,MOTRIN) 800 MG tablet Take 1 tablet (800 mg total) by mouth every 8 (eight) hours as needed for mild pain. 04/28/16   Kristen N Ward, DO  oxyCODONE-acetaminophen (PERCOCET) 5-325 MG tablet Take 1-2 tablets by mouth every 4 (four) hours as needed. 11/06/16   Jarrett Soho Muthersbaugh, PA-C    Family History History reviewed. No pertinent family history.  Social History Social History  Substance Use Topics  . Smoking status: Current Every Day Smoker    Packs/day:  0.50    Types: Cigarettes  . Smokeless tobacco: Never Used  . Alcohol use Yes     Comment: occasional     Allergies   Patient has no known allergies.   Review of Systems Review of Systems  Constitutional: Negative for fever.  HENT: Positive for dental problem and facial swelling. Negative for trouble swallowing.   Neurological: Positive for headaches.   Physical Exam Updated Vital Signs BP 135/89 (BP Location: Left Arm)   Pulse (!) 116   Temp 98.2 F (36.8 C) (Oral)   Resp 18   SpO2 99%   Physical Exam  Constitutional: He appears well-developed and well-nourished. No distress.  HENT:  Head: Normocephalic and atraumatic.  Large abscess on hard palate with palpable fluctuance. No fluctuance or induration to the buccal mucosa or floor of the mouth. No trismus. No uvula deviation. Poor dentition noted.   Eyes: Conjunctivae are normal.  Cardiovascular: Normal rate.   Pulmonary/Chest: Effort normal.  Abdominal: He exhibits no distension.  Neurological: He is alert.  Skin: Skin is warm and dry.  Psychiatric: He has a normal mood and affect.  Nursing note and vitals reviewed.  ED Treatments / Results  DIAGNOSTIC STUDIES:  Oxygen Saturation is 99% on RA, normal by my interpretation.    COORDINATION OF CARE:  9:24 PM Discussed treatment plan with pt at bedside and pt agreed to plan.   Labs (all labs ordered are listed, but only abnormal results are displayed)  Labs Reviewed - No data to display  EKG  EKG Interpretation None       Radiology No results found.  Procedures Dental Block Date/Time: 11/25/2016 9:29 PM Performed by: Shary Decamp Authorized by: Shary Decamp   Consent:    Consent obtained:  Verbal   Consent given by:  Patient Indications:    Indications: dental abscess   Procedure details (see MAR for exact dosages):    Block anesthetic: marcaine. Post-procedure details:    Outcome:  Pain relieved   Patient tolerance of procedure:  Tolerated  well, no immediate complications Comments:     Since straight incision with 11 blade     (including critical care time)  Medications Ordered in ED Medications - No data to display   Initial Impression / Assessment and Plan / ED Course  I have reviewed the triage vital signs and the nursing notes.  Pertinent labs & imaging results that were available during my care of the patient were reviewed by me and considered in my medical decision making (see chart for details).  Final Clinical Impressions(s) / ED Diagnoses     {I have reviewed the relevant previous healthcare records.     ED Course:  Assessment: Dental pain associated with dental cary but no signs or symptoms of dental abscess with patient afebrile, non toxic appearing and swallowing secretions well. Exam unconcerning for Ludwig's angina or other deep tissue infection in neck. As there is gum swelling, erythema, and facial swelling, will treat with antibiotic and pain medicine. Urged patient to follow-up with dentist. Performed I+D with significant purulent drainage. Pain relieved afterwards.    I gave patient referral to dentist and stressed the importance of dental follow up for ultimate management of dental pain. Patient voices understanding and is agreeable to plan.   Disposition/Plan:  DC Home Additional Verbal discharge instructions given and discussed with patient.  Pt Instructed to f/u with Dentist in the next week for evaluation and treatment of symptoms. Return precautions given Pt acknowledges and agrees with plan  Supervising Physician Gareth Morgan, MD  Final diagnoses:  Pain, dental  Dental caries  Dental abscess    New Prescriptions New Prescriptions   No medications on file   I personally performed the services described in this documentation, which was scribed in my presence. The recorded information has been reviewed and is accurate.    Shary Decamp, PA-C 11/25/16 1224    Gareth Morgan,  MD 11/27/16 1210

## 2016-11-25 NOTE — ED Triage Notes (Signed)
Pt presents to ED for assessment of right sided dental pain.  States he was seen her three weeks ago for the same and finished his antibiotics.  Patient states swelling has returned.

## 2017-04-07 ENCOUNTER — Emergency Department (HOSPITAL_COMMUNITY)
Admission: EM | Admit: 2017-04-07 | Discharge: 2017-04-08 | Disposition: A | Discharge status: Home / Self Care | Payer: PRIVATE HEALTH INSURANCE | Attending: Emergency Medicine | Admitting: Emergency Medicine

## 2017-04-07 ENCOUNTER — Encounter (HOSPITAL_COMMUNITY): Payer: Self-pay | Admitting: Emergency Medicine

## 2017-04-07 DIAGNOSIS — Y93E5 Activity, floor mopping and cleaning: Secondary | ICD-10-CM | POA: Insufficient documentation

## 2017-04-07 DIAGNOSIS — F17210 Nicotine dependence, cigarettes, uncomplicated: Secondary | ICD-10-CM | POA: Insufficient documentation

## 2017-04-07 DIAGNOSIS — I10 Essential (primary) hypertension: Secondary | ICD-10-CM | POA: Insufficient documentation

## 2017-04-07 DIAGNOSIS — S39012A Strain of muscle, fascia and tendon of lower back, initial encounter: Secondary | ICD-10-CM | POA: Insufficient documentation

## 2017-04-07 DIAGNOSIS — K047 Periapical abscess without sinus: Secondary | ICD-10-CM | POA: Insufficient documentation

## 2017-04-07 DIAGNOSIS — Z79899 Other long term (current) drug therapy: Secondary | ICD-10-CM | POA: Insufficient documentation

## 2017-04-07 DIAGNOSIS — Y999 Unspecified external cause status: Secondary | ICD-10-CM | POA: Insufficient documentation

## 2017-04-07 DIAGNOSIS — X509XXA Other and unspecified overexertion or strenuous movements or postures, initial encounter: Secondary | ICD-10-CM | POA: Insufficient documentation

## 2017-04-07 DIAGNOSIS — T148XXA Other injury of unspecified body region, initial encounter: Secondary | ICD-10-CM

## 2017-04-07 DIAGNOSIS — Y929 Unspecified place or not applicable: Secondary | ICD-10-CM | POA: Insufficient documentation

## 2017-04-07 LAB — COMPREHENSIVE METABOLIC PANEL
ALBUMIN: 4 g/dL (ref 3.5–5.0)
ALK PHOS: 59 U/L (ref 38–126)
ALT: 26 U/L (ref 17–63)
AST: 39 U/L (ref 15–41)
Anion gap: 10 (ref 5–15)
BILIRUBIN TOTAL: 0.8 mg/dL (ref 0.3–1.2)
BUN: 15 mg/dL (ref 6–20)
CALCIUM: 8.7 mg/dL — AB (ref 8.9–10.3)
CO2: 23 mmol/L (ref 22–32)
Chloride: 104 mmol/L (ref 101–111)
Creatinine, Ser: 0.92 mg/dL (ref 0.61–1.24)
GFR calc Af Amer: >60 mL/min (ref >60)
GFR calc non Af Amer: >60 mL/min (ref >60)
GLUCOSE: 89 mg/dL (ref 65–99)
Potassium: 4 mmol/L (ref 3.5–5.1)
Sodium: 137 mmol/L (ref 135–145)
TOTAL PROTEIN: 6.9 g/dL (ref 6.5–8.1)

## 2017-04-07 LAB — CBC WITH DIFFERENTIAL/PLATELET
BASOS ABS: 0 10*3/uL (ref 0.0–0.1)
Basophils Relative: 0 %
Eosinophils Absolute: 0.2 10*3/uL (ref 0.0–0.7)
Eosinophils Relative: 1 %
HEMATOCRIT: 37.7 % — AB (ref 39.0–52.0)
HEMOGLOBIN: 13.7 g/dL (ref 13.0–17.0)
LYMPHS PCT: 26 %
Lymphs Abs: 4.2 10*3/uL — ABNORMAL HIGH (ref 0.7–4.0)
MCH: 30 pg (ref 26.0–34.0)
MCHC: 36.3 g/dL — ABNORMAL HIGH (ref 30.0–36.0)
MCV: 82.5 fL (ref 78.0–100.0)
MONOS PCT: 10 %
Monocytes Absolute: 1.6 10*3/uL — ABNORMAL HIGH (ref 0.1–1.0)
NEUTROS ABS: 10 10*3/uL — AB (ref 1.7–7.7)
Neutrophils Relative %: 63 %
Platelets: 266 10*3/uL (ref 150–400)
RBC: 4.57 MIL/uL (ref 4.22–5.81)
RDW: 14.1 % (ref 11.5–15.5)
WBC: 16 10*3/uL — AB (ref 4.0–10.5)

## 2017-04-07 LAB — URINALYSIS, ROUTINE W REFLEX MICROSCOPIC
Bacteria, UA: NONE SEEN
Bilirubin Urine: NEGATIVE
Glucose, UA: NEGATIVE mg/dL
Ketones, ur: NEGATIVE mg/dL
Leukocytes, UA: NEGATIVE
Nitrite: NEGATIVE
PROTEIN: NEGATIVE mg/dL
SPECIFIC GRAVITY, URINE: 1.017 (ref 1.005–1.030)
Squamous Epithelial / LPF: NONE SEEN
pH: 6 (ref 5.0–8.0)

## 2017-04-07 MED ORDER — LIDOCAINE-EPINEPHRINE (PF) 2 %-1:200000 IJ SOLN
10.0000 mL | Freq: Once | INTRAMUSCULAR | Status: AC
  Administered 2017-04-07: 10 mL via INTRADERMAL
  Filled 2017-04-07: qty 20

## 2017-04-07 NOTE — ED Triage Notes (Signed)
Patient reports right upper toothache and right lower back pain onset this week .

## 2017-04-07 NOTE — ED Provider Notes (Signed)
[facility] DEPT Provider Note   CSN: [id number] Arrival date & time: 04/07/17  2233     History   Chief Complaint Chief Complaint  Patient presents with  . Dental Pain  . Back Pain    HPI Lance Stone is a 51 y.o. male.  HPI  52 year old male with history of prior dental abscesses here for recurrence of his dental abscess which she noted 2 days ago. Has been progressively worsening since. Endorsing pain with palpation. No trouble breathing or swallowing. No fevers. No maxillary sinus pressure or pain. Cannot follow up with dentistry or maxillofacial surgeon due to financial constraints. Denies any other alleviating) factors.  Patient also endorsing right mid/lower back pain has been ongoing for several months. Pain is exacerbated with movement and palpation of the right paraspinal muscles. 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 He notices that that tends to make it worse. Has tried over-the-counter medication which provided moderate amount of relief. Denies any trauma. No urinary or bowel incontinence. No difficulty ambulating or lower extremity weakness.  Denies any other physical complaints  Past Medical History:  Diagnosis Date  . Hypertension     There are no active problems to display for this patient.   Past Surgical History:  Procedure Laterality Date  . TONSILLECTOMY         Home Medications    Prior to Admission medications   Medication Sig Start Date End Date Taking? Authorizing Provider  amoxicillin-clavulanate (AUGMENTIN) 875-125 MG tablet Take 1 tablet by mouth every 12 (twelve) hours. 11/25/16   Shary Decamp, PA-C  clindamycin (CLEOCIN) 150 MG capsule Take 3 capsules (450 mg total) by mouth 3 (three) times daily. 11/06/16   Muthersbaugh, Jarrett Soho, PA-C  ibuprofen (ADVIL,MOTRIN) 600 MG tablet Take 1 tablet (600 mg total) by mouth every 6 (six) hours as needed. 11/25/16   Shary Decamp, PA-C  oxyCODONE-acetaminophen (PERCOCET)  5-325 MG tablet Take 1-2 tablets by mouth every 4 (four) hours as needed. 11/06/16   Muthersbaugh, Jarrett Soho, PA-C    Family History No family history on file.  Social History Social History  Substance Use Topics  . Smoking status: Current Every Day Smoker    Packs/day: 0.50    Types: Cigarettes  . Smokeless tobacco: Never Used  . Alcohol use Yes     Comment: occasional     Allergies   Patient has no known allergies.   Review of Systems Review of Systems All other systems are reviewed and are negative for acute change except as noted in the HPI   Physical Exam Updated Vital Signs BP (!) 158/92   Pulse 97   Temp 98.4 F (36.9 C) (Oral)   Resp 18   SpO2 95%   Physical Exam  Constitutional: He is oriented to person, place, and time. He appears well-developed and well-nourished. No distress.  HENT:  Head: Normocephalic and atraumatic.  Nose: Nose normal.  Mouth/Throat: Abnormal dentition. Dental abscesses present. No posterior oropharyngeal edema, posterior oropharyngeal erythema or tonsillar abscesses. No tonsillar exudate.    Eyes: Pupils are equal, round, and reactive to light. Conjunctivae and EOM are normal. Right eye exhibits no discharge. Left eye exhibits no discharge. No scleral icterus.  Neck: Normal range of motion. Neck supple.  Cardiovascular: Normal rate and regular rhythm.  Exam reveals no gallop and no friction rub.   No murmur heard. Pulmonary/Chest: Effort normal and breath sounds normal. No stridor. No respiratory distress. He has no rales.  Abdominal: Soft. He  exhibits no distension. There is no tenderness.  Musculoskeletal: He exhibits no edema.       Thoracic back: He exhibits tenderness and spasm. He exhibits no bony tenderness.       Back:  Neurological: He is alert and oriented to person, place, and time.  Skin: Skin is warm and dry. No rash noted. He is not diaphoretic. No erythema.  Psychiatric: He has a normal mood and affect.  Vitals  reviewed.    ED Treatments / Results  Labs (all labs ordered are listed, but only abnormal results are displayed) Labs Reviewed  URINALYSIS, ROUTINE W REFLEX MICROSCOPIC - Abnormal; Notable for the following:       Result Value   Hgb urine dipstick SMALL (*)    All other components within normal limits  CBC WITH DIFFERENTIAL/PLATELET - Abnormal; Notable for the following:    WBC 16.0 (*)    HCT 37.7 (*)    MCHC 36.3 (*)    Neutro Abs 10.0 (*)    Lymphs Abs 4.2 (*)    Monocytes Absolute 1.6 (*)    All other components within normal limits  COMPREHENSIVE METABOLIC PANEL    EKG  EKG Interpretation None       Radiology No results found.  Procedures .Marland KitchenIncision and Drainage Date/Time: 04/08/2017 12:58 AM Performed by: Fatima Blank Authorized by: Fatima Blank   Consent:    Consent obtained:  Verbal   Consent given by:  Patient   Risks discussed:  Incomplete drainage, bleeding and pain   Alternatives discussed:  Alternative treatment Location:    Type:  Abscess   Size:  2.5   Location:  Mouth   Mouth location:  Alveolar process Pre-procedure details:    Skin preparation:  Chloraprep Anesthesia (see MAR for exact dosages):    Anesthesia method:  Local infiltration   Local anesthetic:  Lidocaine 2% WITH epi Procedure type:    Complexity:  Complex Procedure details:    Needle aspiration: no     Incision types:  Cruciate   Scalpel blade:  11   Wound management:  Probed and deloculated   Drainage:  Purulent and bloody   Drainage amount:  Moderate   Wound treatment:  Wound left open   Packing materials:  None Post-procedure details:    Patient tolerance of procedure:  Tolerated well, no immediate complications   (including critical care time)  Medications Ordered in ED Medications - No data to display   Initial Impression / Assessment and Plan / ED Course  I have reviewed the triage vital signs and the nursing notes.  Pertinent labs &  imaging results that were available during my care of the patient were reviewed by me and considered in my medical decision making (see chart for details).      Dental abscess. Recurrent. He does not follow up with dentistry or maxilofacial MD due to financial restraints. I&D as above. Pen VK course Rx'd.  Back pain in thoracolumbar area for several months without signs of radicular pain. No acute traumatic onset. No red flag symptoms of fever, weight loss, saddle anesthesia, weakness, fecal/urinary incontinence or urinary retention.   Suspect MSK etiology. No indication for imaging emergently. Patient was recommended to take short course of scheduled NSAIDs and engage in early mobility as definitive treatment. Return precautions discussed for worsening or new concerning symptoms.   Final Clinical Impressions(s) / ED Diagnoses   Final diagnoses:  Dental abscess  Muscle strain   Disposition: Discharge  Condition: Good  I have discussed the results, Dx and Tx plan with the patient who expressed understanding and agree(s) with the plan. Discharge instructions discussed at great length. The patient was given strict return precautions who verbalized understanding of the instructions. No further questions at time of discharge.    New Prescriptions   PENICILLIN V POTASSIUM (VEETID) 500 MG TABLET    Take 1 tablet (500 mg total) by mouth 4 (four) times daily.      Fatima Blank, MD 04/08/17 [phone]

## 2017-04-07 NOTE — ED Notes (Signed)
Pt called for room x1

## 2017-04-08 MED ORDER — PENICILLIN V POTASSIUM 500 MG PO TABS: 500.0000 mg | ORAL_TABLET | Freq: Four times a day (QID) | ORAL | 0 refills | Status: AC

## 2017-04-08 MED ORDER — PENICILLIN V POTASSIUM 250 MG PO TABS
500.0000 mg | ORAL_TABLET | Freq: Once | ORAL | Status: AC
  Administered 2017-04-08: 500 mg via ORAL
  Filled 2017-04-08: qty 2

## 2018-02-09 ENCOUNTER — Emergency Department (HOSPITAL_COMMUNITY): Payer: Self-pay

## 2018-02-09 ENCOUNTER — Encounter (HOSPITAL_COMMUNITY): Payer: Self-pay | Admitting: Emergency Medicine

## 2018-02-09 ENCOUNTER — Emergency Department (HOSPITAL_COMMUNITY)
Admission: EM | Admit: 2018-02-09 | Discharge: 2018-02-09 | Disposition: A | Discharge status: Home / Self Care | Payer: Self-pay | Attending: Emergency Medicine | Admitting: Emergency Medicine

## 2018-02-09 DIAGNOSIS — J189 Pneumonia, unspecified organism: Secondary | ICD-10-CM | POA: Insufficient documentation

## 2018-02-09 DIAGNOSIS — I10 Essential (primary) hypertension: Secondary | ICD-10-CM | POA: Insufficient documentation

## 2018-02-09 DIAGNOSIS — F17210 Nicotine dependence, cigarettes, uncomplicated: Secondary | ICD-10-CM | POA: Insufficient documentation

## 2018-02-09 DIAGNOSIS — Z79899 Other long term (current) drug therapy: Secondary | ICD-10-CM | POA: Insufficient documentation

## 2018-02-09 DIAGNOSIS — R109 Unspecified abdominal pain: Secondary | ICD-10-CM

## 2018-02-09 LAB — URINALYSIS, MICROSCOPIC (REFLEX)
Bacteria, UA: NONE SEEN
RBC / HPF: NONE SEEN RBC/hpf (ref 0–5)
SQUAMOUS EPITHELIAL / LPF: NONE SEEN (ref 0–5)
WBC, UA: NONE SEEN WBC/hpf (ref 0–5)

## 2018-02-09 LAB — ETHANOL: Alcohol, Ethyl (B): <10 mg/dL (ref <10)

## 2018-02-09 LAB — URINALYSIS, ROUTINE W REFLEX MICROSCOPIC
Bilirubin Urine: NEGATIVE
Glucose, UA: NEGATIVE mg/dL
LEUKOCYTES UA: NEGATIVE
NITRITE: NEGATIVE
PH: 5.5 (ref 5.0–8.0)
Protein, ur: 100 mg/dL — AB

## 2018-02-09 LAB — COMPREHENSIVE METABOLIC PANEL
ALBUMIN: 4.5 g/dL (ref 3.5–5.0)
ALT: 46 U/L — ABNORMAL HIGH (ref 0–44)
AST: 43 U/L — AB (ref 15–41)
Alkaline Phosphatase: 49 U/L (ref 38–126)
Anion gap: 13 (ref 5–15)
BUN: 12 mg/dL (ref 6–20)
CHLORIDE: 103 mmol/L (ref 98–111)
CO2: 27 mmol/L (ref 22–32)
Calcium: 9.3 mg/dL (ref 8.9–10.3)
Creatinine, Ser: 0.82 mg/dL (ref 0.61–1.24)
GFR calc Af Amer: >60 mL/min (ref >60)
Glucose, Bld: 128 mg/dL — ABNORMAL HIGH (ref 70–99)
POTASSIUM: 4.2 mmol/L (ref 3.5–5.1)
Sodium: 143 mmol/L (ref 135–145)
Total Bilirubin: 1.3 mg/dL — ABNORMAL HIGH (ref 0.3–1.2)
Total Protein: 7.7 g/dL (ref 6.5–8.1)

## 2018-02-09 LAB — RAPID URINE DRUG SCREEN, HOSP PERFORMED
AMPHETAMINES: NOT DETECTED
BENZODIAZEPINES: NOT DETECTED
Cocaine: NOT DETECTED
OPIATES: NOT DETECTED
Tetrahydrocannabinol: POSITIVE — AB

## 2018-02-09 LAB — CBC WITH DIFFERENTIAL/PLATELET
ABS IMMATURE GRANULOCYTES: 0.1 10*3/uL (ref 0.0–0.1)
Basophils Absolute: 0 10*3/uL (ref 0.0–0.1)
Basophils Relative: 0 %
EOS PCT: 0 %
Eosinophils Absolute: 0 10*3/uL (ref 0.0–0.7)
HEMATOCRIT: 42.6 % (ref 39.0–52.0)
Hemoglobin: 15.1 g/dL (ref 13.0–17.0)
IMMATURE GRANULOCYTES: 0 %
LYMPHS PCT: 14 %
Lymphs Abs: 1.7 10*3/uL (ref 0.7–4.0)
MCH: 29.5 pg (ref 26.0–34.0)
MCHC: 35.4 g/dL (ref 30.0–36.0)
MCV: 83.2 fL (ref 78.0–100.0)
Monocytes Absolute: 0.5 10*3/uL (ref 0.1–1.0)
Monocytes Relative: 4 %
NEUTROS PCT: 82 %
Neutro Abs: 10.3 10*3/uL — ABNORMAL HIGH (ref 1.7–7.7)
Platelets: 253 10*3/uL (ref 150–400)
RBC: 5.12 MIL/uL (ref 4.22–5.81)
RDW: 13.2 % (ref 11.5–15.5)
WBC: 12.6 10*3/uL — AB (ref 4.0–10.5)

## 2018-02-09 LAB — LIPASE, BLOOD: LIPASE: 25 U/L (ref 11–51)

## 2018-02-09 MED ORDER — SODIUM CHLORIDE 0.9 % IV SOLN
1.0000 g | Freq: Once | INTRAVENOUS | Status: AC
  Administered 2018-02-09: 1 g via INTRAVENOUS
  Filled 2018-02-09: qty 10

## 2018-02-09 MED ORDER — METOCLOPRAMIDE HCL 5 MG/ML IJ SOLN
10.0000 mg | Freq: Once | INTRAMUSCULAR | Status: AC
  Administered 2018-02-09: 10 mg via INTRAVENOUS
  Filled 2018-02-09: qty 2

## 2018-02-09 MED ORDER — AZITHROMYCIN 250 MG PO TABS
500.0000 mg | ORAL_TABLET | Freq: Once | ORAL | Status: AC
  Administered 2018-02-09: 500 mg via ORAL
  Filled 2018-02-09: qty 2

## 2018-02-09 MED ORDER — SODIUM CHLORIDE 0.9 % IV BOLUS
1000.0000 mL | Freq: Once | INTRAVENOUS | Status: AC
  Administered 2018-02-09: 1000 mL via INTRAVENOUS

## 2018-02-09 MED ORDER — ONDANSETRON 4 MG PO TBDP: 4.0000 mg | ORAL_TABLET | Freq: Three times a day (TID) | ORAL | 0 refills | Status: DC | PRN

## 2018-02-09 MED ORDER — KETOROLAC TROMETHAMINE 30 MG/ML IJ SOLN
15.0000 mg | Freq: Once | INTRAMUSCULAR | Status: AC
  Administered 2018-02-09: 15 mg via INTRAVENOUS
  Filled 2018-02-09: qty 1

## 2018-02-09 MED ORDER — LEVOFLOXACIN 750 MG PO TABS: 750.0000 mg | ORAL_TABLET | Freq: Every day | ORAL | 0 refills | Status: DC

## 2018-02-09 NOTE — ED Provider Notes (Signed)
[facility] EMERGENCY DEPARTMENT Provider Note   CSN: [id number] Arrival date & time: 02/09/18  4128     History   Chief Complaint Chief Complaint  Patient presents with  . Nausea    HPI Lance Stone is a 52 y.o. male who presents with chills, N/V. PMH significant for HTN, recurrent dental abscess, homelessness. He states that yesterday he was in his usual state of health. Last night he started to have nausea and multiple episodes of emesis. He also has had chills and sweats. He has a cough but states it's not particularly bad. He also has periumbilical abdominal pain. He received Zofran by EMS but still reports being nauseous. He endorses significant alcohol and marijuana use and states he wants to quit. No fever, chest pain, SOB, diarrhea, urinary symptoms He thinks someone else at the shelter had similar symptoms a couple days ago.  HPI  Past Medical History:  Diagnosis Date  . Hypertension     There are no active problems to display for this patient.   Past Surgical History:  Procedure Laterality Date  . TONSILLECTOMY          Home Medications    Prior to Admission medications   Medication Sig Start Date End Date Taking? Authorizing Provider  amoxicillin-clavulanate (AUGMENTIN) 875-125 MG tablet Take 1 tablet by mouth every 12 (twelve) hours. 11/25/16   Shary Decamp, PA-C  clindamycin (CLEOCIN) 150 MG capsule Take 3 capsules (450 mg total) by mouth 3 (three) times daily. 11/06/16   Muthersbaugh, Jarrett Soho, PA-C  ibuprofen (ADVIL,MOTRIN) 600 MG tablet Take 1 tablet (600 mg total) by mouth every 6 (six) hours as needed. 11/25/16   Shary Decamp, PA-C  oxyCODONE-acetaminophen (PERCOCET) 5-325 MG tablet Take 1-2 tablets by mouth every 4 (four) hours as needed. 11/06/16   Muthersbaugh, Gwenlyn Perking    Family History History reviewed. No pertinent family history.  Social History Social History   Tobacco Use  . Smoking status: Current Every Day Smoker   Packs/day: 0.50    Types: Cigarettes  . Smokeless tobacco: Never Used  Substance Use Topics  . Alcohol use: Yes    Comment: occasional  . Drug use: Yes    Types: Marijuana     Allergies   Patient has no known allergies.   Review of Systems Review of Systems  Constitutional: Positive for chills and diaphoresis. Negative for fever.  HENT: Negative for dental problem.   Respiratory: Positive for cough. Negative for shortness of breath and wheezing.   Cardiovascular: Negative for chest pain.  Gastrointestinal: Positive for abdominal pain, nausea and vomiting. Negative for diarrhea.  Genitourinary: Negative for dysuria, flank pain and hematuria.  All other systems reviewed and are negative.    Physical Exam Updated Vital Signs BP (!) 169/82   Pulse 69   Temp 98.6 F (37 C) (Oral)   Resp 12   SpO2 99%   Physical Exam  Constitutional: He is oriented to person, place, and time. He appears well-developed and well-nourished. No distress.  Appear uncomfortable and is diaphoretic and cool  HENT:  Head: Normocephalic and atraumatic.  Eyes: Pupils are equal, round, and reactive to light. Conjunctivae are normal. Right eye exhibits no discharge. Left eye exhibits no discharge. No scleral icterus.  Neck: Normal range of motion.  Cardiovascular: Normal rate and regular rhythm.  Pulmonary/Chest: Effort normal and breath sounds normal. No respiratory distress.  Abdominal: Soft. Bowel sounds are normal. He exhibits no distension and no mass. There is  tenderness (epigastric and periumbilical tenderness). There is no rebound and no guarding. No hernia.  Neurological: He is alert and oriented to person, place, and time.  Skin: Skin is warm and dry.  Psychiatric: He has a normal mood and affect. His behavior is normal.  Nursing note and vitals reviewed.    ED Treatments / Results  Labs (all labs ordered are listed, but only abnormal results are displayed) Labs Reviewed  COMPREHENSIVE  METABOLIC PANEL - Abnormal; Notable for the following components:      Result Value   Glucose, Bld 128 (*)    AST 43 (*)    ALT 46 (*)    Total Bilirubin 1.3 (*)    All other components within normal limits  CBC WITH DIFFERENTIAL/PLATELET - Abnormal; Notable for the following components:   WBC 12.6 (*)    Neutro Abs 10.3 (*)    All other components within normal limits  ETHANOL  LIPASE, BLOOD  URINALYSIS, ROUTINE W REFLEX MICROSCOPIC  RAPID URINE DRUG SCREEN, HOSP PERFORMED    EKG EKG Interpretation  Date/Time:  Saturday February 09 2018 09:02:59 EDT Ventricular Rate:  65 PR Interval:    QRS Duration: 76 QT Interval:  409 QTC Calculation: 426 R Axis:   81 Text Interpretation:  Sinus rhythm Consider left ventricular hypertrophy ST elev, probable normal early repol pattern No significant change since last tracing Abnormal ekg Confirmed by Carmin Muskrat [phone]) on 02/09/2018 9:06:53 AM Also confirmed by Carmin Muskrat ([id number]), editor [place], Janett Billow [phone])  on 02/09/2018 10:47:17 AM   Radiology Dg Chest 2 View  Result Date: 02/09/2018 CLINICAL DATA:  Cough EXAM: CHEST - 2 VIEW COMPARISON:  11/15/2010 chest radiograph. FINDINGS: Stable cardiomediastinal silhouette with normal heart size. No pneumothorax. No pleural effusion. No pulmonary edema. New mild hazy opacity silhouetting the left heart border. IMPRESSION: New mild hazy opacity silhouetting the left heart border, which may indicate a lingular pneumonia. Recommend follow-up PA and lateral post treatment chest radiographs in 4-6 weeks. Electronically Signed   By: Ilona Sorrel M.D.   On: 02/09/2018 09:23   US Abdomen Limited Ruq  Result Date: 02/09/2018 CLINICAL DATA:  Right upper quadrant pain. EXAM: ULTRASOUND ABDOMEN LIMITED RIGHT UPPER QUADRANT COMPARISON:  None. FINDINGS: Gallbladder: No gallstones or wall thickening visualized. No sonographic Murphy sign noted by sonographer. Common bile duct: Diameter: 3.4 mm Liver: No focal  lesion identified. Within normal limits in parenchymal echogenicity. Portal vein is patent on color Doppler imaging with normal direction of blood flow towards the liver. IMPRESSION: Normal study. Electronically Signed   By: Dorise Bullion III M.D   On: 02/09/2018 12:39    Procedures Procedures (including critical care time)  Medications Ordered in ED Medications  sodium chloride 0.9 % bolus 1,000 mL (0 mLs Intravenous Stopped 02/09/18 1002)  ketorolac (TORADOL) 30 MG/ML injection 15 mg (15 mg Intravenous Given 02/09/18 0901)  metoCLOPramide (REGLAN) injection 10 mg (10 mg Intravenous Given 02/09/18 0900)  cefTRIAXone (ROCEPHIN) 1 g in sodium chloride 0.9 % 100 mL IVPB (0 g Intravenous Stopped 02/09/18 1201)  azithromycin (ZITHROMAX) tablet 500 mg (500 mg Oral Given 02/09/18 1124)     Initial Impression / Assessment and Plan / ED Course  I have reviewed the triage vital signs and the nursing notes.  Pertinent labs & imaging results that were available during my care of the patient were reviewed by me and considered in my medical decision making (see chart for details).  52 year old male presents with chills and nausea  with vomiting.  He is hypertensive but otherwise vital signs are normal.  On exam heart is normal rate and rhythm, Lungs are clear to auscultation, abdomen is minimally tender in the periumbilical and epigastric area.  He has no obvious signs of infection on exam but is diaphoretic.  Will obtain blood work, urine, chest x-ray.  We will also obtain an EKG since he is diaphoretic, vomiting has epigastric tenderness.  He is given fluids, Toradol, Reglan.  CBC is remarkable for mild leukocytosis of 12.6.  He does have chronic leukocytosis.  CMP is remarkable for mild elevation of the liver enzymes and bilirubin.  Right upper quadrant ultrasound is normal.  UA is remarkable for high specific gravity, small hemoglobin, trace ketones and 100 protein.  Chest x-ray is remarkable for possible  pneumonia.  He was given Rocephin and azithromycin.  Since he is an alcoholic we will give him a prescription for Levaquin for 5 days.  Question a component of alcohol withdrawal today.  He was offered a prescription for Librium and declined.  He was also offered outpatient resources for rehab and he also declined this stating that he used to smoke crack and was able to quit cold [place] on its own 11 years ago.  He tolerated p.o.  He was discharged with return precautions.   Final Clinical Impressions(s) / ED Diagnoses   Final diagnoses:  Abdominal pain  Community acquired pneumonia, unspecified laterality    ED Discharge Orders    None       Recardo Evangelist, PA-C 02/09/18 1350    Carmin Muskrat, MD 02/11/18 1355

## 2018-02-09 NOTE — ED Notes (Signed)
Patient transported to Ultrasound 

## 2018-02-09 NOTE — ED Notes (Signed)
Patient transported to X-ray 

## 2018-02-09 NOTE — ED Notes (Signed)
Pt also admits to smoking weed last night

## 2018-02-09 NOTE — Discharge Instructions (Addendum)
Take Levaquin once a day for 5 days Take Zofran for nausea Return if you are worsening

## 2018-02-09 NOTE — ED Notes (Signed)
Back from Xray.

## 2018-02-09 NOTE — ED Triage Notes (Signed)
Pt here from [facility] with c/o n/v , pt states that he only drank 1 beer last night , pt received 4 mg Zofran from ems

## 2018-02-10 ENCOUNTER — Emergency Department (HOSPITAL_COMMUNITY): Payer: Self-pay

## 2018-02-10 ENCOUNTER — Encounter (HOSPITAL_COMMUNITY): Payer: Self-pay | Admitting: Emergency Medicine

## 2018-02-10 ENCOUNTER — Other Ambulatory Visit: Payer: Self-pay

## 2018-02-10 ENCOUNTER — Emergency Department (HOSPITAL_COMMUNITY)
Admission: EM | Admit: 2018-02-10 | Discharge: 2018-02-10 | Disposition: A | Discharge status: Home / Self Care | Payer: Self-pay | Attending: Emergency Medicine | Admitting: Emergency Medicine

## 2018-02-10 DIAGNOSIS — Z590 Homelessness: Secondary | ICD-10-CM | POA: Insufficient documentation

## 2018-02-10 DIAGNOSIS — R05 Cough: Secondary | ICD-10-CM | POA: Insufficient documentation

## 2018-02-10 DIAGNOSIS — R112 Nausea with vomiting, unspecified: Secondary | ICD-10-CM | POA: Insufficient documentation

## 2018-02-10 DIAGNOSIS — D4412 Neoplasm of uncertain behavior of left adrenal gland: Secondary | ICD-10-CM | POA: Insufficient documentation

## 2018-02-10 DIAGNOSIS — I10 Essential (primary) hypertension: Secondary | ICD-10-CM | POA: Insufficient documentation

## 2018-02-10 DIAGNOSIS — F17210 Nicotine dependence, cigarettes, uncomplicated: Secondary | ICD-10-CM | POA: Insufficient documentation

## 2018-02-10 LAB — CBC WITH DIFFERENTIAL/PLATELET
Basophils Absolute: 0 10*3/uL (ref 0.0–0.1)
Basophils Relative: 0 %
Eosinophils Absolute: 0 10*3/uL (ref 0.0–0.7)
Eosinophils Relative: 0 %
HEMATOCRIT: 41.9 % (ref 39.0–52.0)
HEMOGLOBIN: 15.1 g/dL (ref 13.0–17.0)
LYMPHS PCT: 14 %
Lymphs Abs: 2.4 10*3/uL (ref 0.7–4.0)
MCH: 30.8 pg (ref 26.0–34.0)
MCHC: 36 g/dL (ref 30.0–36.0)
MCV: 85.3 fL (ref 78.0–100.0)
MONOS PCT: 9 %
Monocytes Absolute: 1.5 10*3/uL — ABNORMAL HIGH (ref 0.1–1.0)
NEUTROS PCT: 77 %
Neutro Abs: 13.2 10*3/uL — ABNORMAL HIGH (ref 1.7–7.7)
Platelets: 224 10*3/uL (ref 150–400)
RBC: 4.91 MIL/uL (ref 4.22–5.81)
RDW: 13.7 % (ref 11.5–15.5)
WBC: 17.1 10*3/uL — AB (ref 4.0–10.5)

## 2018-02-10 LAB — COMPREHENSIVE METABOLIC PANEL
ALBUMIN: 4.2 g/dL (ref 3.5–5.0)
ALK PHOS: 46 U/L (ref 38–126)
ALT: 37 U/L (ref 0–44)
AST: 29 U/L (ref 15–41)
Anion gap: 8 (ref 5–15)
BUN: 16 mg/dL (ref 6–20)
CHLORIDE: 100 mmol/L (ref 98–111)
CO2: 30 mmol/L (ref 22–32)
CREATININE: 0.81 mg/dL (ref 0.61–1.24)
Calcium: 8.9 mg/dL (ref 8.9–10.3)
Glucose, Bld: 118 mg/dL — ABNORMAL HIGH (ref 70–99)
POTASSIUM: 3.5 mmol/L (ref 3.5–5.1)
Sodium: 138 mmol/L (ref 135–145)
Total Bilirubin: 2.3 mg/dL — ABNORMAL HIGH (ref 0.3–1.2)
Total Protein: 7.4 g/dL (ref 6.5–8.1)

## 2018-02-10 LAB — URINALYSIS, ROUTINE W REFLEX MICROSCOPIC
Bilirubin Urine: NEGATIVE
Glucose, UA: NEGATIVE mg/dL
Ketones, ur: 20 mg/dL — AB
Leukocytes, UA: NEGATIVE
Nitrite: NEGATIVE
PROTEIN: 30 mg/dL — AB
pH: 6 (ref 5.0–8.0)

## 2018-02-10 MED ORDER — SODIUM CHLORIDE 0.9 % IV BOLUS
1000.0000 mL | Freq: Once | INTRAVENOUS | Status: AC
  Administered 2018-02-10: 1000 mL via INTRAVENOUS

## 2018-02-10 MED ORDER — ONDANSETRON HCL 4 MG/2ML IJ SOLN
4.0000 mg | Freq: Once | INTRAMUSCULAR | Status: AC
  Administered 2018-02-10: 4 mg via INTRAVENOUS
  Filled 2018-02-10: qty 2

## 2018-02-10 MED ORDER — AEROCHAMBER PLUS FLO-VU MEDIUM MISC
1.0000 | Freq: Once | Status: AC
  Administered 2018-02-10: 1
  Filled 2018-02-10: qty 1

## 2018-02-10 MED ORDER — ALBUTEROL SULFATE HFA 108 (90 BASE) MCG/ACT IN AERS
1.0000 | INHALATION_SPRAY | RESPIRATORY_TRACT | Status: DC | PRN
  Administered 2018-02-10: 2 via RESPIRATORY_TRACT
  Filled 2018-02-10: qty 6.7

## 2018-02-10 MED ORDER — IOPAMIDOL (ISOVUE-300) INJECTION 61%
INTRAVENOUS | Status: AC
  Filled 2018-02-10: qty 100

## 2018-02-10 MED ORDER — PROMETHAZINE HCL 25 MG PO TABS: 25.0000 mg | ORAL_TABLET | Freq: Four times a day (QID) | ORAL | 0 refills | Status: DC | PRN

## 2018-02-10 MED ORDER — IPRATROPIUM-ALBUTEROL 0.5-2.5 (3) MG/3ML IN SOLN
3.0000 mL | Freq: Once | RESPIRATORY_TRACT | Status: AC
  Administered 2018-02-10: 3 mL via RESPIRATORY_TRACT

## 2018-02-10 MED ORDER — IOPAMIDOL (ISOVUE-300) INJECTION 61%
100.0000 mL | Freq: Once | INTRAVENOUS | Status: AC | PRN
  Administered 2018-02-10: 100 mL via INTRAVENOUS

## 2018-02-10 NOTE — ED Notes (Signed)
Bed: WA16 Expected date:  Expected time:  Means of arrival:  Comments: ems 

## 2018-02-10 NOTE — ED Notes (Signed)
Spoke with CT, IV placement okay.

## 2018-02-10 NOTE — ED Triage Notes (Signed)
Pt presents with N/V. Patient seen yesterday for same and diagnosed with PNA. Patient has not picked up his medications and states he does not feel better.

## 2018-02-10 NOTE — ED Notes (Signed)
Pt given urinal and made aware of need for urine specimen 

## 2018-02-10 NOTE — ED Provider Notes (Signed)
[facility] DEPT Provider Note   CSN: [id number] Arrival date & time: 02/10/18  7062     History   Chief Complaint Chief Complaint  Patient presents with  . Nausea  . Emesis    HPI Lance Stone is a 52 y.o. male.  Pt presents to the ED today with n/v and cough.  The pt went to [facility] yesterday for the same.  He was diagnosed with pna and given rx for levaquin.  He was given rocephin and zithromax in ED.  He has not gotten his rx filled.  He said he coughed all last night and vomited as well.  He said he mostly vomits after coughing.  He is homeless.     Past Medical History:  Diagnosis Date  . Hypertension     There are no active problems to display for this patient.   Past Surgical History:  Procedure Laterality Date  . TONSILLECTOMY          Home Medications    Prior to Admission medications   Medication Sig Start Date End Date Taking? Authorizing Provider  diphenhydrAMINE (BENADRYL) 25 MG tablet Take 50 mg by mouth every 6 (six) hours as needed for itching or allergies.   Yes [provider]  ibuprofen (ADVIL,MOTRIN) 200 MG tablet Take 800 mg by mouth every 6 (six) hours as needed for fever, headache or mild pain.   Yes [provider]  levofloxacin (LEVAQUIN) 750 MG tablet Take 1 tablet (750 mg total) by mouth daily. 02/09/18   Recardo Evangelist, PA-C  ondansetron (ZOFRAN ODT) 4 MG disintegrating tablet Take 1 tablet (4 mg total) by mouth every 8 (eight) hours as needed for nausea or vomiting. 02/09/18   Recardo Evangelist, PA-C  promethazine (PHENERGAN) 25 MG tablet Take 1 tablet (25 mg total) by mouth every 6 (six) hours as needed for nausea or vomiting. 02/10/18   Isla Pence, MD    Family History No family history on file.  Social History Social History   Tobacco Use  . Smoking status: Current Every Day Smoker    Packs/day: 0.50    Types: Cigarettes  . Smokeless tobacco: Never Used  Substance Use  Topics  . Alcohol use: Yes    Comment: occasional  . Drug use: Yes    Types: Marijuana     Allergies   Patient has no known allergies.   Review of Systems Review of Systems  Respiratory: Positive for cough.   Gastrointestinal: Positive for nausea and vomiting.  All other systems reviewed and are negative.    Physical Exam Updated Vital Signs BP (!) 172/103 (BP Location: Right Arm)   Pulse 63   Temp 98.6 F (37 C) (Oral)   Resp 16   SpO2 100%   Physical Exam  Constitutional: He is oriented to person, place, and time. He appears well-developed and well-nourished.  HENT:  Head: Normocephalic and atraumatic.  Right Ear: External ear normal.  Left Ear: External ear normal.  Nose: Nose normal.  Mouth/Throat: Oropharynx is clear and moist.  Eyes: Pupils are equal, round, and reactive to light. Conjunctivae and EOM are normal.  Neck: Normal range of motion. Neck supple.  Cardiovascular: Normal rate, regular rhythm, normal heart sounds and intact distal pulses.  Pulmonary/Chest: Effort normal and breath sounds normal.  Abdominal: Soft. Bowel sounds are normal.  Musculoskeletal: Normal range of motion.  Neurological: He is alert and oriented to person, place, and time.  Skin: Skin is warm.  Capillary refill takes less than 2 seconds.  Psychiatric: He has a normal mood and affect. His behavior is normal. Judgment and thought content normal.  Nursing note and vitals reviewed.    ED Treatments / Results  Labs (all labs ordered are listed, but only abnormal results are displayed) Labs Reviewed  COMPREHENSIVE METABOLIC PANEL - Abnormal; Notable for the following components:      Result Value   Glucose, Bld 118 (*)    Total Bilirubin 2.3 (*)    All other components within normal limits  CBC WITH DIFFERENTIAL/PLATELET - Abnormal; Notable for the following components:   WBC 17.1 (*)    Neutro Abs 13.2 (*)    Monocytes Absolute 1.5 (*)    All other components within normal  limits  URINALYSIS, ROUTINE W REFLEX MICROSCOPIC    EKG None  Radiology Dg Chest 2 View  Result Date: 02/10/2018 CLINICAL DATA:  Cough with nausea and vomiting EXAM: CHEST - 2 VIEW COMPARISON:  February 09, 2018 FINDINGS: There is no evident edema or consolidation. Heart size and pulmonary vascularity are normal. No adenopathy. No bone lesions. IMPRESSION: No edema or consolidation. The opacity seen near the left heart border 1 day prior is currently not appreciable. Stable cardiac silhouette. Electronically Signed   By: Lowella Grip III M.D.   On: 02/10/2018 07:47   Dg Chest 2 View  Result Date: 02/09/2018 CLINICAL DATA:  Cough EXAM: CHEST - 2 VIEW COMPARISON:  11/15/2010 chest radiograph. FINDINGS: Stable cardiomediastinal silhouette with normal heart size. No pneumothorax. No pleural effusion. No pulmonary edema. New mild hazy opacity silhouetting the left heart border. IMPRESSION: New mild hazy opacity silhouetting the left heart border, which may indicate a lingular pneumonia. Recommend follow-up PA and lateral post treatment chest radiographs in 4-6 weeks. Electronically Signed   By: Ilona Sorrel M.D.   On: 02/09/2018 09:23   Ct Chest W Contrast  Result Date: 02/10/2018 CLINICAL DATA:  Cough, nausea, vomiting. EXAM: CT CHEST, ABDOMEN, AND PELVIS WITH CONTRAST TECHNIQUE: Multidetector CT imaging of the chest, abdomen and pelvis was performed following the standard protocol during bolus administration of intravenous contrast. CONTRAST:  151mL ISOVUE-300 IOPAMIDOL (ISOVUE-300) INJECTION 61% COMPARISON:  Chest radiograph from earlier today. 02/09/2018 abdominal sonogram. FINDINGS: CT CHEST FINDINGS Cardiovascular: Normal heart size. No significant pericardial effusion/thickening. Left anterior descending coronary atherosclerosis. Mildly atherosclerotic nonaneurysmal thoracic aorta. Normal caliber pulmonary arteries. No central pulmonary emboli. Mediastinum/Nodes: No discrete thyroid nodules.  Unremarkable esophagus. No pathologically enlarged axillary, mediastinal or hilar lymph nodes. Lungs/Pleura: No pneumothorax. No pleural effusion. Minimal scarring versus atelectasis in the dependent lower lobes. No acute consolidative airspace disease or lung masses. Subpleural solid 4 mm anterior apical right upper lobe pulmonary nodule (series 4/image 34). No additional significant pulmonary nodules. Musculoskeletal: No aggressive appearing focal osseous lesions. Minimal thoracic spondylosis. CT ABDOMEN PELVIS FINDINGS Hepatobiliary: Normal liver with no liver mass. Normal gallbladder with no radiopaque cholelithiasis. No biliary ductal dilatation. Pancreas: Normal, with no mass or duct dilation. Spleen: Normal size. No mass. Adrenals/Urinary Tract: Right adrenal. Hypodense left adrenal 1.4 cm nodule with density 54 HU (series 2/image 58). Normal kidneys with no hydronephrosis and no renal mass. Normal bladder. Stomach/Bowel: Normal non-distended stomach. Normal caliber small bowel. Short segment jejunal intussusception (series 2/image 81). No small bowel wall thickening. Normal appendix. Normal large bowel with no diverticulosis, large bowel wall thickening or pericolonic fat stranding. Vascular/Lymphatic: Atherosclerotic nonaneurysmal abdominal aorta. Patent portal, splenic, hepatic and renal veins. No pathologically enlarged lymph nodes in  the abdomen or pelvis. Reproductive: Normal size prostate. Other: No pneumoperitoneum, ascites or focal fluid collection. Musculoskeletal: No aggressive appearing focal osseous lesions. Degenerative geode in the superior left acetabulum. IMPRESSION: 1. No acute consolidative airspace disease to suggest a pneumonia. Minimal scarring versus atelectasis in the dependent lower lobes. 2. Solitary 4 mm subpleural right upper lobe pulmonary nodule, probably benign. No follow-up needed if patient is low-risk. Non-contrast chest CT can be considered in 12 months if patient is  high-risk. This recommendation follows the consensus statement: Guidelines for Management of Incidental Pulmonary Nodules Detected on CT Images:From the Fleischner Society 2017; published online before print (10.1148/[id number]). 3. One vessel coronary atherosclerosis. 4. Short segment jejunal intussusception, usually transient and of no clinical significance. No evidence of small bowel obstruction. No bowel wall thickening. 5. Indeterminate 1.4 cm left adrenal nodule, presumably an adenoma. Consider follow-up adrenal protocol CT abdomen without and with IV contrast in 12 months. This recommendation follows ACR consensus guidelines: Management of Incidental Adrenal Masses: A White Paper of the ACR Incidental Findings Committee. J Am Coll Radiol 2017;14:1038-1044. 6.  Aortic Atherosclerosis (ICD10-I70.0). Electronically Signed   By: Ilona Sorrel M.D.   On: 02/10/2018 09:24   Ct Abdomen Pelvis W Contrast  Result Date: 02/10/2018 CLINICAL DATA:  Cough, nausea, vomiting. EXAM: CT CHEST, ABDOMEN, AND PELVIS WITH CONTRAST TECHNIQUE: Multidetector CT imaging of the chest, abdomen and pelvis was performed following the standard protocol during bolus administration of intravenous contrast. CONTRAST:  118mL ISOVUE-300 IOPAMIDOL (ISOVUE-300) INJECTION 61% COMPARISON:  Chest radiograph from earlier today. 02/09/2018 abdominal sonogram. FINDINGS: CT CHEST FINDINGS Cardiovascular: Normal heart size. No significant pericardial effusion/thickening. Left anterior descending coronary atherosclerosis. Mildly atherosclerotic nonaneurysmal thoracic aorta. Normal caliber pulmonary arteries. No central pulmonary emboli. Mediastinum/Nodes: No discrete thyroid nodules. Unremarkable esophagus. No pathologically enlarged axillary, mediastinal or hilar lymph nodes. Lungs/Pleura: No pneumothorax. No pleural effusion. Minimal scarring versus atelectasis in the dependent lower lobes. No acute consolidative airspace disease or lung  masses. Subpleural solid 4 mm anterior apical right upper lobe pulmonary nodule (series 4/image 34). No additional significant pulmonary nodules. Musculoskeletal: No aggressive appearing focal osseous lesions. Minimal thoracic spondylosis. CT ABDOMEN PELVIS FINDINGS Hepatobiliary: Normal liver with no liver mass. Normal gallbladder with no radiopaque cholelithiasis. No biliary ductal dilatation. Pancreas: Normal, with no mass or duct dilation. Spleen: Normal size. No mass. Adrenals/Urinary Tract: Right adrenal. Hypodense left adrenal 1.4 cm nodule with density 54 HU (series 2/image 58). Normal kidneys with no hydronephrosis and no renal mass. Normal bladder. Stomach/Bowel: Normal non-distended stomach. Normal caliber small bowel. Short segment jejunal intussusception (series 2/image 81). No small bowel wall thickening. Normal appendix. Normal large bowel with no diverticulosis, large bowel wall thickening or pericolonic fat stranding. Vascular/Lymphatic: Atherosclerotic nonaneurysmal abdominal aorta. Patent portal, splenic, hepatic and renal veins. No pathologically enlarged lymph nodes in the abdomen or pelvis. Reproductive: Normal size prostate. Other: No pneumoperitoneum, ascites or focal fluid collection. Musculoskeletal: No aggressive appearing focal osseous lesions. Degenerative geode in the superior left acetabulum. IMPRESSION: 1. No acute consolidative airspace disease to suggest a pneumonia. Minimal scarring versus atelectasis in the dependent lower lobes. 2. Solitary 4 mm subpleural right upper lobe pulmonary nodule, probably benign. No follow-up needed if patient is low-risk. Non-contrast chest CT can be considered in 12 months if patient is high-risk. This recommendation follows the consensus statement: Guidelines for Management of Incidental Pulmonary Nodules Detected on CT Images:From the Fleischner Society 2017; published online before print (10.1148/[id number]). 3. One vessel coronary  atherosclerosis. 4. Short  segment jejunal intussusception, usually transient and of no clinical significance. No evidence of small bowel obstruction. No bowel wall thickening. 5. Indeterminate 1.4 cm left adrenal nodule, presumably an adenoma. Consider follow-up adrenal protocol CT abdomen without and with IV contrast in 12 months. This recommendation follows ACR consensus guidelines: Management of Incidental Adrenal Masses: A White Paper of the ACR Incidental Findings Committee. J Am Coll Radiol 2017;14:1038-1044. 6.  Aortic Atherosclerosis (ICD10-I70.0). Electronically Signed   By: Ilona Sorrel M.D.   On: 02/10/2018 09:24   US Abdomen Limited Ruq  Result Date: 02/09/2018 CLINICAL DATA:  Right upper quadrant pain. EXAM: ULTRASOUND ABDOMEN LIMITED RIGHT UPPER QUADRANT COMPARISON:  None. FINDINGS: Gallbladder: No gallstones or wall thickening visualized. No sonographic Murphy sign noted by sonographer. Common bile duct: Diameter: 3.4 mm Liver: No focal lesion identified. Within normal limits in parenchymal echogenicity. Portal vein is patent on color Doppler imaging with normal direction of blood flow towards the liver. IMPRESSION: Normal study. Electronically Signed   By: Dorise Bullion III M.D   On: 02/09/2018 12:39    Procedures Procedures (including critical care time)  Medications Ordered in ED Medications  iopamidol (ISOVUE-300) 61 % injection (has no administration in time range)  albuterol (PROVENTIL HFA;VENTOLIN HFA) 108 (90 Base) MCG/ACT inhaler 1-2 puff (has no administration in time range)  AEROCHAMBER PLUS FLO-VU MEDIUM MISC 1 each (has no administration in time range)  sodium chloride 0.9 % bolus 1,000 mL (0 mLs Intravenous Stopped 02/10/18 0916)  ondansetron (ZOFRAN) injection 4 mg (4 mg Intravenous Given 02/10/18 0729)  ipratropium-albuterol (DUONEB) 0.5-2.5 (3) MG/3ML nebulizer solution 3 mL (3 mLs Nebulization Given 02/10/18 0800)  iopamidol (ISOVUE-300) 61 % injection 100 mL (100 mLs  Intravenous Contrast Given 02/10/18 0854)     Initial Impression / Assessment and Plan / ED Course  I have reviewed the triage vital signs and the nursing notes.  Pertinent labs & imaging results that were available during my care of the patient were reviewed by me and considered in my medical decision making (see chart for details).     Pt is feeling better.  The pt does NOT have pna.  The pt's n/v is after coughing which has been helped by neb.  Pt to be given an albuterol inhaler/spacer prior to d/c and rx for phenergan (6.71 at [facility]).  He is told that he does not need to fill the levaquin.  Return if worse.  Final Clinical Impressions(s) / ED Diagnoses   Final diagnoses:  Non-intractable vomiting with nausea, unspecified vomiting type    ED Discharge Orders        Ordered    promethazine (PHENERGAN) 25 MG tablet  Every 6 hours PRN     02/10/18 0946       Isla Pence, MD 02/10/18 [phone]

## 2018-03-28 ENCOUNTER — Emergency Department (HOSPITAL_COMMUNITY)
Admission: EM | Admit: 2018-03-28 | Discharge: 2018-03-28 | Disposition: A | Discharge status: Home / Self Care | Payer: Self-pay | Attending: Emergency Medicine | Admitting: Emergency Medicine

## 2018-03-28 ENCOUNTER — Emergency Department (HOSPITAL_COMMUNITY): Payer: Self-pay

## 2018-03-28 ENCOUNTER — Other Ambulatory Visit: Payer: Self-pay

## 2018-03-28 ENCOUNTER — Encounter (HOSPITAL_COMMUNITY): Payer: Self-pay

## 2018-03-28 DIAGNOSIS — F1290 Cannabis use, unspecified, uncomplicated: Secondary | ICD-10-CM | POA: Diagnosis present

## 2018-03-28 DIAGNOSIS — Z7982 Long term (current) use of aspirin: Secondary | ICD-10-CM | POA: Insufficient documentation

## 2018-03-28 DIAGNOSIS — R112 Nausea with vomiting, unspecified: Secondary | ICD-10-CM | POA: Insufficient documentation

## 2018-03-28 DIAGNOSIS — R1084 Generalized abdominal pain: Secondary | ICD-10-CM | POA: Insufficient documentation

## 2018-03-28 DIAGNOSIS — I10 Essential (primary) hypertension: Secondary | ICD-10-CM | POA: Insufficient documentation

## 2018-03-28 DIAGNOSIS — Z79899 Other long term (current) drug therapy: Secondary | ICD-10-CM | POA: Insufficient documentation

## 2018-03-28 DIAGNOSIS — Z87891 Personal history of nicotine dependence: Secondary | ICD-10-CM | POA: Insufficient documentation

## 2018-03-28 LAB — COMPREHENSIVE METABOLIC PANEL
ALT: 26 U/L (ref 0–44)
ANION GAP: 9 (ref 5–15)
AST: 20 U/L (ref 15–41)
Albumin: 4.3 g/dL (ref 3.5–5.0)
Alkaline Phosphatase: 60 U/L (ref 38–126)
BUN: 27 mg/dL — ABNORMAL HIGH (ref 6–20)
CALCIUM: 8.8 mg/dL — AB (ref 8.9–10.3)
CO2: 29 mmol/L (ref 22–32)
CREATININE: 1.2 mg/dL (ref 0.61–1.24)
Chloride: 96 mmol/L — ABNORMAL LOW (ref 98–111)
Glucose, Bld: 106 mg/dL — ABNORMAL HIGH (ref 70–99)
Potassium: 3.7 mmol/L (ref 3.5–5.1)
SODIUM: 134 mmol/L — AB (ref 135–145)
TOTAL PROTEIN: 7.6 g/dL (ref 6.5–8.1)
Total Bilirubin: 2.2 mg/dL — ABNORMAL HIGH (ref 0.3–1.2)

## 2018-03-28 LAB — CBC WITH DIFFERENTIAL/PLATELET
BASOS ABS: 0 10*3/uL (ref 0.0–0.1)
BASOS PCT: 0 %
EOS PCT: 0 %
Eosinophils Absolute: 0 10*3/uL (ref 0.0–0.7)
HCT: 48.4 % (ref 39.0–52.0)
Hemoglobin: 17.9 g/dL — ABNORMAL HIGH (ref 13.0–17.0)
LYMPHS PCT: 17 %
Lymphs Abs: 2.6 10*3/uL (ref 0.7–4.0)
MCH: 30.3 pg (ref 26.0–34.0)
MCHC: 37 g/dL — AB (ref 30.0–36.0)
MCV: 82 fL (ref 78.0–100.0)
MONO ABS: 1.4 10*3/uL — AB (ref 0.1–1.0)
MONOS PCT: 10 %
Neutro Abs: 11 10*3/uL — ABNORMAL HIGH (ref 1.7–7.7)
Neutrophils Relative %: 73 %
PLATELETS: 213 10*3/uL (ref 150–400)
RBC: 5.9 MIL/uL — ABNORMAL HIGH (ref 4.22–5.81)
RDW: 13.6 % (ref 11.5–15.5)
WBC: 15 10*3/uL — ABNORMAL HIGH (ref 4.0–10.5)

## 2018-03-28 LAB — URINALYSIS, ROUTINE W REFLEX MICROSCOPIC
Bilirubin Urine: NEGATIVE
GLUCOSE, UA: NEGATIVE mg/dL
HGB URINE DIPSTICK: NEGATIVE
Ketones, ur: 20 mg/dL — AB
Leukocytes, UA: NEGATIVE
NITRITE: NEGATIVE
PH: 5 (ref 5.0–8.0)
Protein, ur: 30 mg/dL — AB
SPECIFIC GRAVITY, URINE: 1.023 (ref 1.005–1.030)

## 2018-03-28 LAB — I-STAT TROPONIN, ED: Troponin i, poc: 0.01 ng/mL (ref 0.00–0.08)

## 2018-03-28 LAB — LIPASE, BLOOD: LIPASE: 28 U/L (ref 11–51)

## 2018-03-28 MED ORDER — SODIUM CHLORIDE 0.9 % IV BOLUS
2000.0000 mL | Freq: Once | INTRAVENOUS | Status: AC
  Administered 2018-03-28: 2000 mL via INTRAVENOUS

## 2018-03-28 MED ORDER — ONDANSETRON 4 MG PO TBDP: 4.0000 mg | ORAL_TABLET | Freq: Three times a day (TID) | ORAL | 0 refills | Status: DC | PRN

## 2018-03-28 MED ORDER — ONDANSETRON HCL 4 MG/2ML IJ SOLN
4.0000 mg | Freq: Once | INTRAMUSCULAR | Status: AC
  Administered 2018-03-28: 4 mg via INTRAVENOUS
  Filled 2018-03-28: qty 2

## 2018-03-28 NOTE — ED Provider Notes (Signed)
[facility] DEPT Provider Note   CSN: [id number] Arrival date & time: 03/28/18  1030     History   Chief Complaint Chief Complaint  Patient presents with  . Abdominal Pain    HPI Lance Stone is a 52 y.o. male who presents today for evaluation of nausea and vomiting.  He drank orange juice on Saturday and that was in date.  He reports that since then he has been vomiting.  Yesterday he was feeling better and ate something yesterday and this morning when he got up he was throwing up.  Used MJ Saturday.  He reports that he uses cannabis on a daily basis.  Reports diffuse abdominal pain.  No vomiting or diarrhea.  He denies any urinary symptoms.  No recent trauma.    HPI  Past Medical History:  Diagnosis Date  . Hypertension     Patient Active Problem List   Diagnosis Date Noted  . Marijuana smoker 03/28/2018    Past Surgical History:  Procedure Laterality Date  . TONSILLECTOMY          Home Medications    Prior to Admission medications   Medication Sig Start Date End Date Taking? Authorizing Provider  albuterol (PROVENTIL HFA;VENTOLIN HFA) 108 (90 Base) MCG/ACT inhaler Inhale 1-2 puffs into the lungs every 6 (six) hours as needed for wheezing or shortness of breath.   Yes [provider]  aspirin 500 MG EC tablet Take 500 mg by mouth every 6 (six) hours as needed for pain.   Yes [provider]  ibuprofen (ADVIL,MOTRIN) 200 MG tablet Take 200 mg by mouth every 6 (six) hours as needed for fever, headache or mild pain.    Yes [provider]  levofloxacin (LEVAQUIN) 750 MG tablet Take 1 tablet (750 mg total) by mouth daily. Patient not taking: Reported on 03/28/2018 02/09/18   Recardo Evangelist, PA-C  ondansetron (ZOFRAN ODT) 4 MG disintegrating tablet Take 1 tablet (4 mg total) by mouth every 8 (eight) hours as needed for nausea or vomiting. 03/28/18   Lorin Glass, PA-C  promethazine (PHENERGAN) 25 MG  tablet Take 1 tablet (25 mg total) by mouth every 6 (six) hours as needed for nausea or vomiting. Patient not taking: Reported on 03/28/2018 02/10/18   Isla Pence, MD    Family History No family history on file.  Social History Social History   Tobacco Use  . Smoking status: Former Smoker    Packs/day: 0.50    Types: Cigarettes  . Smokeless tobacco: Never Used  Substance Use Topics  . Alcohol use: Yes    Comment: occasional  . Drug use: Yes    Types: Marijuana     Allergies   Patient has no known allergies.   Review of Systems Review of Systems  Constitutional: Negative for chills and fever.  HENT: Negative for ear pain and sore throat.   Eyes: Negative for pain and visual disturbance.  Respiratory: Negative for cough and shortness of breath.   Cardiovascular: Negative for chest pain and palpitations.  Gastrointestinal: Positive for abdominal pain, nausea and vomiting. Negative for constipation and diarrhea.  Genitourinary: Negative for dysuria and hematuria.  Musculoskeletal: Negative for arthralgias and back pain.  Skin: Negative for color change and rash.  Neurological: Negative for seizures, syncope, weakness and headaches.  All other systems reviewed and are negative.    Physical Exam Updated Vital Signs BP (!) 169/95   Pulse 73   Temp 98.2 F (36.8  C)   Resp 19   Ht 5\' 10"  (1.778 m)   Wt 81.6 kg   SpO2 100%   BMI 25.83 kg/m   Physical Exam  Constitutional: He appears well-developed and well-nourished.  Non-toxic appearance. No distress.  HENT:  Head: Normocephalic and atraumatic.  Mouth/Throat: Oropharynx is clear and moist.  Eyes: Conjunctivae are normal.  Neck: Neck supple.  Cardiovascular: Normal rate and regular rhythm.  No murmur heard. Pulmonary/Chest: Effort normal and breath sounds normal. No respiratory distress.  Abdominal: Soft. Normal appearance and bowel sounds are normal. There is generalized tenderness (No localized or pinpoint  tender to palpation.).  Not actively vomiting during exam.  Musculoskeletal: He exhibits no edema.  Neurological: He is alert.  Skin: Skin is warm and dry.  Psychiatric: He has a normal mood and affect.  Nursing note and vitals reviewed.    ED Treatments / Results  Labs (all labs ordered are listed, but only abnormal results are displayed) Labs Reviewed  CBC WITH DIFFERENTIAL/PLATELET - Abnormal; Notable for the following components:      Result Value   WBC 15.0 (*)    RBC 5.90 (*)    Hemoglobin 17.9 (*)    MCHC 37.0 (*)    Neutro Abs 11.0 (*)    Monocytes Absolute 1.4 (*)    All other components within normal limits  COMPREHENSIVE METABOLIC PANEL - Abnormal; Notable for the following components:   Sodium 134 (*)    Chloride 96 (*)    Glucose, Bld 106 (*)    BUN 27 (*)    Calcium 8.8 (*)    Total Bilirubin 2.2 (*)    All other components within normal limits  URINALYSIS, ROUTINE W REFLEX MICROSCOPIC - Abnormal; Notable for the following components:   Ketones, ur 20 (*)    Protein, ur 30 (*)    Bacteria, UA RARE (*)    All other components within normal limits  LIPASE, BLOOD  I-STAT TROPONIN, ED    EKG None  Radiology Dg Chest 2 View  Result Date: 03/28/2018 CLINICAL DATA:  Fever, cough since Friday EXAM: CHEST - 2 VIEW COMPARISON:  CT chest 02/10/2018 FINDINGS: The heart size and mediastinal contours are within normal limits. Both lungs are clear. The visualized skeletal structures are unremarkable. IMPRESSION: No active cardiopulmonary disease. Electronically Signed   By: Kathreen Devoid   On: 03/28/2018 11:15    Procedures Procedures (including critical care time)  Medications Ordered in ED Medications  sodium chloride 0.9 % bolus 2,000 mL ( Intravenous Stopped 03/28/18 1345)  ondansetron (ZOFRAN) injection 4 mg (4 mg Intravenous Given 03/28/18 1145)     Initial Impression / Assessment and Plan / ED Course  I have reviewed the triage vital signs and the  nursing notes.  Pertinent labs & imaging results that were available during my care of the patient were reviewed by me and considered in my medical decision making (see chart for details).    Patient presents today for evaluation of diffuse abdominal pain and 3 days of vomiting.  On exam his pain is not localized to one specific area, rather his general.  He appears comfortable, and does not grimace with abdominal palpation.  He reports nausea, with vomiting.  Labs were obtained and reviewed, hemoglobin is slightly elevated consistent with mild dehydration.  His white count is elevated at 15, however it appears that on all recent blood work since 2017 he has had a slightly elevated white count.  His neutrophils are  improved from when he was seen early July as are his total white count.  He was given 2 L of IV fluids, after which he urinated multiple times and reported feeling better.  Even that his pain is not localized to any one specific area, lower suspicion for significant acute intra-abdominal process.  Discussed possible work-up and treatment plans with patient who declined CT scan.  This is reasonable I feel given that he had one a little over a month ago when he was in the ER for similar symptoms without significant pathology found.  He was given Zofran all lab to rest in the department.  He was p.o. challenged which he passed.  I suspect that his symptoms may be related to cannabinoid hyperemesis.  He was educated on this condition and instructed to cease all marijuana use.  Return precautions were discussed.   Final Clinical Impressions(s) / ED Diagnoses   Final diagnoses:  Generalized abdominal pain  Non-intractable vomiting with nausea, unspecified vomiting type  Marijuana smoker    ED Discharge Orders         Ordered    ondansetron (ZOFRAN ODT) 4 MG disintegrating tablet  Every 8 hours PRN     03/28/18 1452           Ollen Gross 03/28/18 [place], Kevin,  MD 03/29/18 [phone]

## 2018-03-28 NOTE — ED Notes (Signed)
Pt aware UA needed, urinal at bedside. Pt will hit call light when he has voided

## 2018-03-28 NOTE — Discharge Instructions (Addendum)
Your symptoms today appear consistent with marijuana associated vomiting (cannabinoid hyperemesis syndrome).  This is a syndrome seen in people who use marijuana. It is associated with nausea, vomiting, abdominal pain, and generally not feeling well.  Often times patients will report that their symptoms can be relieved with a warm shower.  The best way to prevent this is to completely stop smoking/using all forms of marijuana.  Some people find symptom relief from applying capsaicin cream to their abdomen (belly).  Please be advised that capsaicin cream may cause a burning feeling.  Capsaicin cream may relieve some chest symptoms, however it will not prevent or fully resolve this condition.

## 2018-03-28 NOTE — ED Triage Notes (Addendum)
Pt BIBA from home. Pt c/o abd pain, N/V, dizziness with standing.  Pt seen in July for same. Pt states that he has not had alcohol for 6 weeks, denies drug use. Pt given fluids and zofran en route. Pt also c/o productive cough

## 2018-03-28 NOTE — ED Notes (Signed)
Bed: UJ81 Expected date:  Expected time:  Means of arrival:  Comments: EMS-N/V-abdominal pain

## 2018-10-15 ENCOUNTER — Emergency Department (HOSPITAL_COMMUNITY)
Admission: EM | Admit: 2018-10-15 | Discharge: 2018-10-16 | Disposition: A | Discharge status: Home / Self Care | Payer: Self-pay | Attending: Emergency Medicine | Admitting: Emergency Medicine

## 2018-10-15 ENCOUNTER — Encounter (HOSPITAL_COMMUNITY): Payer: Self-pay | Admitting: Emergency Medicine

## 2018-10-15 ENCOUNTER — Other Ambulatory Visit: Payer: Self-pay

## 2018-10-15 DIAGNOSIS — R55 Syncope and collapse: Secondary | ICD-10-CM | POA: Insufficient documentation

## 2018-10-15 DIAGNOSIS — Z5321 Procedure and treatment not carried out due to patient leaving prior to being seen by health care provider: Secondary | ICD-10-CM | POA: Insufficient documentation

## 2018-10-15 LAB — CBC
HCT: 43.4 % (ref 39.0–52.0)
Hemoglobin: 15.5 g/dL (ref 13.0–17.0)
MCH: 29.1 pg (ref 26.0–34.0)
MCHC: 35.7 g/dL (ref 30.0–36.0)
MCV: 81.6 fL (ref 80.0–100.0)
Platelets: 288 10*3/uL (ref 150–400)
RBC: 5.32 MIL/uL (ref 4.22–5.81)
RDW: 13.6 % (ref 11.5–15.5)
WBC: 14.2 10*3/uL — ABNORMAL HIGH (ref 4.0–10.5)
nRBC: 0 % (ref 0.0–0.2)

## 2018-10-15 LAB — BASIC METABOLIC PANEL
Anion gap: 9 (ref 5–15)
BUN: 21 mg/dL — AB (ref 6–20)
CO2: 30 mmol/L (ref 22–32)
Calcium: 8.6 mg/dL — ABNORMAL LOW (ref 8.9–10.3)
Chloride: 97 mmol/L — ABNORMAL LOW (ref 98–111)
Creatinine, Ser: 1.08 mg/dL (ref 0.61–1.24)
GFR calc Af Amer: >60 mL/min (ref >60)
Glucose, Bld: 91 mg/dL (ref 70–99)
POTASSIUM: 4.4 mmol/L (ref 3.5–5.1)
Sodium: 136 mmol/L (ref 135–145)

## 2018-10-15 LAB — URINALYSIS, ROUTINE W REFLEX MICROSCOPIC
Bilirubin Urine: NEGATIVE
Glucose, UA: NEGATIVE mg/dL
HGB URINE DIPSTICK: NEGATIVE
Ketones, ur: NEGATIVE mg/dL
Leukocytes,Ua: NEGATIVE
Nitrite: NEGATIVE
Protein, ur: NEGATIVE mg/dL
Specific Gravity, Urine: 1.025 (ref 1.005–1.030)
pH: 6 (ref 5.0–8.0)

## 2018-10-15 MED ORDER — SODIUM CHLORIDE 0.9% FLUSH: 3.0000 mL | Freq: Once | INTRAVENOUS | Status: DC

## 2018-10-15 NOTE — ED Triage Notes (Signed)
Pt. Stated, Ive had N/V since last Friday. I finally eased off yesterday but Donnald Garre been passing out and then coming to and shaking all over like a stroke or seizure.

## 2018-10-15 NOTE — ED Notes (Signed)
Pt. Not answering for room x1.

## 2022-06-26 ENCOUNTER — Emergency Department (HOSPITAL_COMMUNITY): Payer: Commercial Managed Care - HMO

## 2022-06-26 ENCOUNTER — Emergency Department (HOSPITAL_COMMUNITY)
Admission: EM | Admit: 2022-06-26 | Discharge: 2022-06-26 | Disposition: A | Discharge status: Home / Self Care | Payer: Commercial Managed Care - HMO | Attending: Emergency Medicine | Admitting: Emergency Medicine

## 2022-06-26 ENCOUNTER — Other Ambulatory Visit: Payer: Self-pay

## 2022-06-26 ENCOUNTER — Encounter (HOSPITAL_COMMUNITY): Payer: Self-pay

## 2022-06-26 DIAGNOSIS — M25551 Pain in right hip: Secondary | ICD-10-CM | POA: Diagnosis not present

## 2022-06-26 DIAGNOSIS — R059 Cough, unspecified: Secondary | ICD-10-CM | POA: Diagnosis present

## 2022-06-26 DIAGNOSIS — J069 Acute upper respiratory infection, unspecified: Secondary | ICD-10-CM | POA: Insufficient documentation

## 2022-06-26 DIAGNOSIS — M898X8 Other specified disorders of bone, other site: Secondary | ICD-10-CM | POA: Insufficient documentation

## 2022-06-26 DIAGNOSIS — Z79899 Other long term (current) drug therapy: Secondary | ICD-10-CM | POA: Insufficient documentation

## 2022-06-26 DIAGNOSIS — R062 Wheezing: Secondary | ICD-10-CM

## 2022-06-26 DIAGNOSIS — Z1152 Encounter for screening for COVID-19: Secondary | ICD-10-CM | POA: Diagnosis not present

## 2022-06-26 DIAGNOSIS — M899 Disorder of bone, unspecified: Secondary | ICD-10-CM

## 2022-06-26 LAB — RESP PANEL BY RT-PCR (FLU A&B, COVID) ARPGX2
Influenza A by PCR: NEGATIVE
Influenza B by PCR: NEGATIVE
SARS Coronavirus 2 by RT PCR: NEGATIVE

## 2022-06-26 MED ORDER — IPRATROPIUM-ALBUTEROL 0.5-2.5 (3) MG/3ML IN SOLN
3.0000 mL | Freq: Once | RESPIRATORY_TRACT | Status: AC
  Administered 2022-06-26: 3 mL via RESPIRATORY_TRACT
  Filled 2022-06-26: qty 3

## 2022-06-26 MED ORDER — ALBUTEROL SULFATE HFA 108 (90 BASE) MCG/ACT IN AERS: 1.0000 | INHALATION_SPRAY | Freq: Four times a day (QID) | RESPIRATORY_TRACT | 0 refills | Status: DC | PRN

## 2022-06-26 MED ORDER — IBUPROFEN 400 MG PO TABS
600.0000 mg | ORAL_TABLET | Freq: Once | ORAL | Status: AC
  Administered 2022-06-26: 600 mg via ORAL
  Filled 2022-06-26: qty 1

## 2022-06-26 NOTE — ED Notes (Signed)
Pt transported to CT ?

## 2022-06-26 NOTE — ED Notes (Signed)
Patient transported to X-ray 

## 2022-06-26 NOTE — ED Provider Triage Note (Signed)
Emergency Medicine Provider Triage Evaluation Note  Lance Stone , a 56 y.o. male  was evaluated in triage.  Pt complains of cough, wheeze that began yesterday insidiously.  Notes known sick exposure to a coworker and is concerned about COVID/influenza.  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  Denies any recent falls/traumas.  States has been taking at home [facility] powder as well as Aleve with some relief of symptoms.  He is requesting further imaging.  Denies fever, chills, night sweats, chest pain, shortness of breath.  He does state that he is going to some pain with coughing episodes but none otherwise.  Review of Systems  Positive: See above Negative:   Physical Exam  BP 137/84 (BP Location: Left Arm)   Pulse 78   Temp 98.7 F (37.1 C) (Oral)   Resp 18   SpO2 96%  Gen:   Awake, no distress   Resp:  Normal effort  MSK:   Moves extremities without difficulty  Other:  Diffuse wheeze/rhonchi upon auscultation of lung fields bilaterally.  No lower extremity edema noted.  Medical Decision Making  Medically screening exam initiated at 4:11 PM.  Appropriate orders placed.  Lyrik P Gorniak was informed that the remainder of the evaluation will be completed by another provider, this initial triage assessment does not replace that evaluation, and the importance of remaining in the ED until their evaluation is complete.     Wilnette Kales, [place] 06/26/22 [phone]

## 2022-06-26 NOTE — ED Notes (Signed)
Pt ambulatory to the bathroom without assistance.  

## 2022-06-26 NOTE — ED Triage Notes (Signed)
Patient reports cough that started yesterday and pain in chest when he coughs.  Reports also pain to right hip from his arthritis.  Reports small amount of white sputum.

## 2022-06-26 NOTE — ED Provider Notes (Signed)
[place] EMERGENCY DEPARTMENT Provider Note   CSN: [id number] Arrival date & time: 06/26/22  1518     History  Chief Complaint  Patient presents with   Cough   Hip Pain    Lance Stone is a 56 y.o. male with h/o marijuana use presents with cough, R hip pain.   Patient reports an acute onset cough that started the day before yesterday associated with nasal congestion.  No sick contacts that he knows of, no fever/chills, shortness of breath, CP, nausea vomiting diarrhea constipation, sore throat.  He has not really coughed up any phlegm.  Endorses some mild wheezing at times and he has no history of COPD or asthma that he is aware of.  Also reports right hip pain that is chronic ongoing for many years.  Has been told that he has arthritis in the right hip after an x-ray he had and was told to follow-up with orthopedic surgery but never did.  He states that over these years it has worsened.  He is still able to walk and has not had any trauma.  No numbness tingling in the leg, no swelling.  As he lays in bed the hip does not hurt it only hurts with weightbearing.   Cough Hip Pain       Home Medications Prior to Admission medications   Medication Sig Start Date End Date Taking? Authorizing Provider  albuterol (VENTOLIN HFA) 108 (90 Base) MCG/ACT inhaler Inhale 1-2 puffs into the lungs every 6 (six) hours as needed for wheezing or shortness of breath. 06/26/22  Yes Audley Hose, MD  aspirin 500 MG EC tablet Take 500 mg by mouth every 6 (six) hours as needed for pain.    [provider]  ibuprofen (ADVIL,MOTRIN) 200 MG tablet Take 200 mg by mouth every 6 (six) hours as needed for fever, headache or mild pain.     [provider]  levofloxacin (LEVAQUIN) 750 MG tablet Take 1 tablet (750 mg total) by mouth daily. Patient not taking: Reported on 03/28/2018 02/09/18   Recardo Evangelist, PA-C  ondansetron (ZOFRAN ODT) 4 MG disintegrating tablet  Take 1 tablet (4 mg total) by mouth every 8 (eight) hours as needed for nausea or vomiting. 03/28/18   Lorin Glass, PA-C  promethazine (PHENERGAN) 25 MG tablet Take 1 tablet (25 mg total) by mouth every 6 (six) hours as needed for nausea or vomiting. Patient not taking: Reported on 03/28/2018 02/10/18   Isla Pence, MD      Allergies    Patient has no known allergies.    Review of Systems   Review of Systems  Respiratory:  Positive for cough.    Review of systems negative for fever/chills.  A 10 point review of systems was performed and is negative unless otherwise reported in HPI.  Physical Exam Updated Vital Signs BP (!) 131/95   Pulse 76   Temp 98.7 F (37.1 C) (Oral)   Resp 16   Ht '5\' 10"'$  (1.778 m)   Wt 79.4 kg   SpO2 96%   BMI 25.11 kg/m  Physical Exam General: Normal appearing male, lying in bed.  HEENT: Sclera anicteric, MMM, trachea midline. Cardiology: RRR, no murmurs/rubs/gallops. BL radial and DP pulses equal bilaterally.  Resp: Normal respiratory rate and effort.  Mild end expiratory wheezes bilaterally. no rhonchi, crackles.  Abd: Soft, non-tender, non-distended. No rebound tenderness or guarding.  GU: Deferred. MSK: No peripheral edema or signs of trauma. Pelvis  stable and nontender. Extremities without deformity or TTP. FROM of BL LEs, Normal gait. NVI. No cyanosis or clubbing. Skin: warm, dry. No rashes or lesions. Neuro: A&Ox4, CNs II-XII grossly intact. MAEs. Sensation grossly intact.  Psych: Normal mood and affect.   ED Results / Procedures / Treatments   Labs (all labs ordered are listed, but only abnormal results are displayed) Labs Reviewed  RESP PANEL BY RT-PCR (FLU A&B, COVID) [id number]    EKG EKG Interpretation  Date/Time:  Monday June 26 2022 16:24:48 EST Ventricular Rate:  70 PR Interval:  164 QRS Duration: 72 QT Interval:  354 QTC Calculation: 382 R Axis:   93 Text Interpretation: Normal sinus rhythm Rightward axis  Borderline ECG When compared with ECG of 15-Oct-2018 18:37, PREVIOUS ECG IS PRESENT similar to prior Confirmed by Wynona Dove ([id number]) on 06/26/2022 5:09:20 PM  Radiology DG Chest 2 View  Result Date: 06/26/2022 CLINICAL DATA:  Repeat with nipple markers. EXAM: CHEST - 2 VIEW COMPARISON:  Chest radiograph dated 06/26/2022. FINDINGS: No focal consolidation, pleural effusion, pneumothorax. Previously described nodular density corresponds to the nipple markers. The cardiac silhouette is within limits. No acute osseous pathology. IMPRESSION: 1. No active cardiopulmonary disease. 2. Previously described density corresponds to the nipple markers. Electronically Signed   By: Anner Crete M.D.   On: 06/26/2022 22:28   CT PELVIS WO CONTRAST  Result Date: 06/26/2022 CLINICAL DATA:  Bone lesion.  Right hip pain. EXAM: CT PELVIS WITHOUT CONTRAST TECHNIQUE: Multidetector CT imaging of the pelvis was performed following the standard protocol without intravenous contrast. RADIATION DOSE REDUCTION: This exam was performed according to the departmental dose-optimization program which includes automated exposure control, adjustment of the mA and/or kV according to patient size and/or use of iterative reconstruction technique. COMPARISON:  X-ray same day.  CT abdomen and pelvis 02/10/2018. FINDINGS: Urinary Tract:  No abnormality visualized. Bowel:  No acute findings.  There is colonic diverticulosis. Vascular/Lymphatic: No pathologically enlarged lymph nodes. There are atherosclerotic calcifications of the aorta. Reproductive:  Prostate gland is within normal limits. Other:  No free fluid.  No focal abdominal wall hernia. Musculoskeletal: There is no acute fracture or dislocation identified. There is some patchy sclerosis in the right inferior pubic ramus which has slightly increased from the prior study. There are no cortical erosive changes. There is no surrounding soft tissue abnormality. There are mild-to-moderate  degenerative changes of both hips with subchondral cystic change bilaterally. Two subcentimeter sclerotic foci in the right femoral head are unchanged and favored as bone islands. Small lucent lesions in the left iliac wing image 5/32 measuring less than 1 cm appear unchanged from prior. IMPRESSION: 1. Patchy sclerosis in the right inferior pubic ramus has slightly increased from the prior study. No cortical erosive changes or surrounding soft tissue abnormality. Findings are favored as benign, but are indeterminate. If patient continues to have right-sided pain, consider follow-up MRI. 2. Stable small lucent lesions in the left iliac wing, indeterminate. 3. Colonic diverticulosis. Aortic Atherosclerosis (ICD10-I70.0). Electronically Signed   By: Ronney Asters M.D.   On: 06/26/2022 21:06   DG Hip Unilat With Pelvis 2-3 Views Right  Result Date: 06/26/2022 CLINICAL DATA:  RIGHT hip pain from arthritis.  Cough. EXAM: DG HIP (WITH OR WITHOUT PELVIS) 2-3V RIGHT COMPARISON:  Radiograph 03/09/2016 FINDINGS: Hips are located. No evidence of pelvic fracture or sacral fracture. Dedicated view of the RIGHT hip demonstrates no femoral neck fracture. Minimal arthropathy RIGHT hip joint. There is asymmetric sclerosis of the RIGHT  inferior pubic ramus over a 3 cm segment. This appears new from comparison radiograph of 03/09/2012. IMPRESSION: 1. No fracture dislocation. 2. No significant arthropathy. 3. Asymmetric sclerosis of the RIGHT inferior pubic ramus. In patient with RIGHT hip pain, recommend CT of the pelvis exclude aggressive osseous lesion. Electronically Signed   By: Suzy Bouchard M.D.   On: 06/26/2022 17:09   DG Chest 2 View  Result Date: 06/26/2022 CLINICAL DATA:  Cough. EXAM: CHEST - 2 VIEW COMPARISON:  Chest x-ray 03/28/2018 FINDINGS: The heart size and mediastinal contours are within normal limits. Vague nodular density projects over the right lower lung measuring 7 mm, possibly nipple shadow. The  lungs are otherwise clear. There is no pleural effusion or pneumothorax. The visualized skeletal structures are unremarkable. IMPRESSION: 1. No active cardiopulmonary disease. 2. Vague nodular density projects over the right lower lung measuring 7 mm, possibly nipple shadow. Recommend repeat chest radiograph with nipple markers. Electronically Signed   By: Ronney Asters M.D.   On: 06/26/2022 17:06    Procedures Procedures    Medications Ordered in ED Medications  ibuprofen (ADVIL) tablet 600 mg (600 mg Oral Given 06/26/22 1621)  ipratropium-albuterol (DUONEB) 0.5-2.5 (3) MG/3ML nebulizer solution 3 mL (3 mLs Nebulization Given 06/26/22 2206)    ED Course/ Medical Decision Making/ A&P                          Medical Decision Making Amount and/or Complexity of Data Reviewed Labs:  Decision-making details documented in ED Course. Radiology: ordered. Decision-making details documented in ED Course.  Risk Prescription drug management.   Patient is overall well-appearing and HDS, no hypoxia or tachypnea, no tachycardia, afebrile. He has end expiratory wheezing but is without respiratory distress or hypoxia.   Patient w/ CXR without any abnormalities. Repeated d/t nipple shadow. No PNA, PTX, pulm edema, pleural effusion. Covid and flu are negative. Patient likely with viral URI. Given his wheezing he is given a duoneb here as well as an albuterol inhaler rx for home q6h prn for wheezing/SOB. He is advised to f/u with PCP within 1 week for f/u.   For patient's hip pain, lower c/f fracture/dislocation given that he is ambulatory but will obtain XRs to evalaute. Demonstrates an asymmetric sclerosis of R inferior pubic ramus. Further eval with CT demonstrates patchy sclerosis that has increased from prior study. Favored to be benign but recommended MRI. Patient is informed of this finding and is advised to f/u with orthopedic surgery, given the contact info and instructed to call in the AM to make  an appointment for o/p eval and MRI.   Patient is discharged in stable condition with albuterol rx, ortho info, and discharge instructions/return precautions.     I have personally reviewed and interpreted all labs and imaging.  Patient was maintained on a cardiac monitor.  I have personally interpreted the telemetry as NSR.  Clinical Course as of 06/26/22 2319  Mon Jun 26, 2022  2008 DG Hip Unilat With Pelvis 2-3 Views Right 1. No fracture dislocation. 2. No significant arthropathy. 3. Asymmetric sclerosis of the RIGHT inferior pubic ramus. In patient with RIGHT hip pain, recommend CT of the pelvis exclude aggressive osseous lesion.   [HN]  2008 DG Chest 2 View 1. No active cardiopulmonary disease. 2. Vague nodular density projects over the right lower lung measuring 7 mm, possibly nipple shadow. Recommend repeat chest radiograph with nipple markers.   [HN]  2008 SARS Coronavirus  2 by RT PCR: NEGATIVE [HN]  2008 Influenza A By PCR: NEGATIVE [HN]  2008 Influenza B By PCR: NEGATIVE [HN]  2243 DG Chest 2 View 1. No active cardiopulmonary disease. 2. Previously described density corresponds to the nipple markers.   [HN]  2243 DG Hip Unilat With Pelvis 2-3 Views Right 1. Patchy sclerosis in the right inferior pubic ramus has slightly increased from the prior study. No cortical erosive changes or surrounding soft tissue abnormality. Findings are favored as benign, but are indeterminate. If patient continues to have right-sided pain, consider follow-up MRI. 2. Stable small lucent lesions in the left iliac wing, indeterminate. 3. Colonic diverticulosis.   [HN]    Clinical Course User Index [HN] Audley Hose, MD          Final Clinical Impression(s) / ED Diagnoses Final diagnoses:  Viral upper respiratory tract infection  Right hip pain  Lesion of pelvic bone  Wheezing on expiration    Rx / DC Orders ED Discharge Orders          Ordered    albuterol  (VENTOLIN HFA) 108 (90 Base) MCG/ACT inhaler  Every 6 hours PRN        06/26/22 2244             This note was created using dictation software, which may contain spelling or grammatical errors.    Audley Hose, MD 06/26/22 [phone]

## 2022-06-26 NOTE — Discharge Instructions (Addendum)
Thank you for coming to [facility] Emergency Department. You were seen for cough and right hip pain. We did an exam, labs, and imaging, and these showed wheezing and a lesion on your right pelvis/hip. You likely have an upper respiratory infection. We have prescribed an albuterol inhaler for the wheezing to be used every 6 hours as needed, 1-2 puffs at a time. This lesion on your hip is favored to be benign but you may benefit from a hip MRI as an outpatient. Please call [facility] at [phone] in the morning to schedule an appointment for follow up.  Please follow up with your primary care provider within 1 week. If you do not have a primary care doctor, you can call [facility] at the number listed to establish with one.  Do not hesitate to return to the ED or call 911 if you experience: -Worsening symptoms -Wheezing or shortness of breath not improved by your albuterol inhaler -Hip pain not improved by tylenol or ibuprofen so severe that you cannot walk -Lightheadedness, passing out -Fevers/chills -Anything else that concerns you

## 2023-02-18 ENCOUNTER — Emergency Department (HOSPITAL_COMMUNITY): Payer: BLUE CROSS/BLUE SHIELD

## 2023-02-18 ENCOUNTER — Encounter (HOSPITAL_COMMUNITY): Payer: Self-pay | Admitting: *Deleted

## 2023-02-18 ENCOUNTER — Emergency Department (HOSPITAL_COMMUNITY)
Admission: EM | Admit: 2023-02-18 | Discharge: 2023-02-18 | Disposition: A | Discharge status: Home / Self Care | Payer: BLUE CROSS/BLUE SHIELD | Attending: Emergency Medicine | Admitting: Emergency Medicine

## 2023-02-18 ENCOUNTER — Other Ambulatory Visit: Payer: Self-pay

## 2023-02-18 DIAGNOSIS — R197 Diarrhea, unspecified: Secondary | ICD-10-CM | POA: Insufficient documentation

## 2023-02-18 DIAGNOSIS — R112 Nausea with vomiting, unspecified: Secondary | ICD-10-CM | POA: Insufficient documentation

## 2023-02-18 DIAGNOSIS — R9431 Abnormal electrocardiogram [ECG] [EKG]: Secondary | ICD-10-CM | POA: Insufficient documentation

## 2023-02-18 DIAGNOSIS — R109 Unspecified abdominal pain: Secondary | ICD-10-CM | POA: Insufficient documentation

## 2023-02-18 DIAGNOSIS — Z7982 Long term (current) use of aspirin: Secondary | ICD-10-CM | POA: Insufficient documentation

## 2023-02-18 LAB — URINALYSIS, ROUTINE W REFLEX MICROSCOPIC
Bacteria, UA: NONE SEEN
Bilirubin Urine: NEGATIVE
Glucose, UA: NEGATIVE mg/dL
Hgb urine dipstick: NEGATIVE
Ketones, ur: NEGATIVE mg/dL
Leukocytes,Ua: NEGATIVE
Nitrite: NEGATIVE
Protein, ur: 30 mg/dL — AB
Specific Gravity, Urine: 1.014 (ref 1.005–1.030)
pH: 7 (ref 5.0–8.0)

## 2023-02-18 LAB — CBC
HCT: 44.3 % (ref 39.0–52.0)
Hemoglobin: 16.5 g/dL (ref 13.0–17.0)
MCH: 29.7 pg (ref 26.0–34.0)
MCHC: 37.2 g/dL — ABNORMAL HIGH (ref 30.0–36.0)
MCV: 79.8 fL — ABNORMAL LOW (ref 80.0–100.0)
Platelets: 303 10*3/uL (ref 150–400)
RBC: 5.55 MIL/uL (ref 4.22–5.81)
RDW: 13.6 % (ref 11.5–15.5)
WBC: 21.5 10*3/uL — ABNORMAL HIGH (ref 4.0–10.5)
nRBC: 0 % (ref 0.0–0.2)

## 2023-02-18 LAB — COMPREHENSIVE METABOLIC PANEL
ALT: 23 U/L (ref 0–44)
AST: 22 U/L (ref 15–41)
Albumin: 3.9 g/dL (ref 3.5–5.0)
Alkaline Phosphatase: 54 U/L (ref 38–126)
Anion gap: 14 (ref 5–15)
BUN: 8 mg/dL (ref 6–20)
CO2: 25 mmol/L (ref 22–32)
Calcium: 9 mg/dL (ref 8.9–10.3)
Chloride: 93 mmol/L — ABNORMAL LOW (ref 98–111)
Creatinine, Ser: 0.93 mg/dL (ref 0.61–1.24)
GFR, Estimated: >60 mL/min (ref >60)
Glucose, Bld: 118 mg/dL — ABNORMAL HIGH (ref 70–99)
Potassium: 4.3 mmol/L (ref 3.5–5.1)
Sodium: 132 mmol/L — ABNORMAL LOW (ref 135–145)
Total Bilirubin: 1 mg/dL (ref 0.3–1.2)
Total Protein: 6.9 g/dL (ref 6.5–8.1)

## 2023-02-18 LAB — LIPASE, BLOOD: Lipase: 31 U/L (ref 11–51)

## 2023-02-18 LAB — TROPONIN I (HIGH SENSITIVITY): Troponin I (High Sensitivity): 10 ng/L (ref <18)

## 2023-02-18 MED ORDER — ONDANSETRON HCL 4 MG/2ML IJ SOLN
4.0000 mg | Freq: Once | INTRAMUSCULAR | Status: AC
  Administered 2023-02-18: 4 mg via INTRAVENOUS
  Filled 2023-02-18: qty 2

## 2023-02-18 MED ORDER — ONDANSETRON HCL 4 MG PO TABS: 4.0000 mg | ORAL_TABLET | Freq: Four times a day (QID) | ORAL | 0 refills | Status: DC

## 2023-02-18 MED ORDER — FAMOTIDINE IN NACL 20-0.9 MG/50ML-% IV SOLN
20.0000 mg | Freq: Once | INTRAVENOUS | Status: AC
  Administered 2023-02-18: 20 mg via INTRAVENOUS
  Filled 2023-02-18: qty 50

## 2023-02-18 MED ORDER — SODIUM CHLORIDE 0.9 % IV BOLUS
1000.0000 mL | Freq: Once | INTRAVENOUS | Status: AC
  Administered 2023-02-18: 1000 mL via INTRAVENOUS

## 2023-02-18 MED ORDER — ACETAMINOPHEN 500 MG PO TABS
1000.0000 mg | ORAL_TABLET | Freq: Once | ORAL | Status: AC
  Administered 2023-02-18: 1000 mg via ORAL
  Filled 2023-02-18: qty 2

## 2023-02-18 MED ORDER — MORPHINE SULFATE (PF) 4 MG/ML IV SOLN
4.0000 mg | Freq: Once | INTRAVENOUS | Status: AC
  Administered 2023-02-18: 4 mg via INTRAVENOUS
  Filled 2023-02-18: qty 1

## 2023-02-18 MED ORDER — IOHEXOL 350 MG/ML SOLN
75.0000 mL | Freq: Once | INTRAVENOUS | Status: AC | PRN
  Administered 2023-02-18: 75 mL via INTRAVENOUS

## 2023-02-18 NOTE — Discharge Instructions (Signed)
Return for any problem.  ?

## 2023-02-18 NOTE — ED Triage Notes (Signed)
Pt states almost constant vomiting and headache since Fri night, after eating hamburger (states 25 x's during the night).  States 1 episode of diarrhea.

## 2023-02-18 NOTE — ED Notes (Signed)
IV fluid second dose withheld.  Pt went to CT with contrast. Fluids not compatible according to tech.

## 2023-02-18 NOTE — ED Provider Notes (Signed)
[place] EMERGENCY DEPARTMENT AT [facility] Provider Note   CSN: [id number] Arrival date & time: 02/18/23  1914     History  Chief Complaint  Patient presents with   Abdominal Pain    Lance Stone is a 57 y.o. male.  57 year old male with prior medical history as detailed below presents for evaluation.  Patient complains of significant nausea and vomiting that began Friday evening.  Patient reports more than 25 rounds of emesis.  He reports that he did have some diarrhea initially but no more over the last 24 hours.  He complains of mild crampy abdominal pain.  He denies fever.  He denies chest pain or difficulty breathing.  The history is provided by the patient and medical records.       Home Medications Prior to Admission medications   Medication Sig Start Date End Date Taking? Authorizing Provider  albuterol (VENTOLIN HFA) 108 (90 Base) MCG/ACT inhaler Inhale 1-2 puffs into the lungs every 6 (six) hours as needed for wheezing or shortness of breath. 06/26/22   Loetta Rough, MD  aspirin 500 MG EC tablet Take 500 mg by mouth every 6 (six) hours as needed for pain.    [provider]  ibuprofen (ADVIL,MOTRIN) 200 MG tablet Take 200 mg by mouth every 6 (six) hours as needed for fever, headache or mild pain.     [provider]  levofloxacin (LEVAQUIN) 750 MG tablet Take 1 tablet (750 mg total) by mouth daily. Patient not taking: Reported on 03/28/2018 02/09/18   Bethel Born, PA-C  ondansetron (ZOFRAN ODT) 4 MG disintegrating tablet Take 1 tablet (4 mg total) by mouth every 8 (eight) hours as needed for nausea or vomiting. 03/28/18   Cristina Gong, PA-C  promethazine (PHENERGAN) 25 MG tablet Take 1 tablet (25 mg total) by mouth every 6 (six) hours as needed for nausea or vomiting. Patient not taking: Reported on 03/28/2018 02/10/18   Jacalyn Lefevre, MD      Allergies    Patient has no known allergies.    Review of Systems    Review of Systems  All other systems reviewed and are negative.   Physical Exam Updated Vital Signs BP (!) 178/103   Pulse 77   Temp 98.6 F (37 C) (Oral)   Resp (!) 22   Ht 5\' 10"  (1.778 m)   Wt 78.5 kg   SpO2 99%   BMI 24.82 kg/m  Physical Exam Vitals and nursing note reviewed.  Constitutional:      General: He is not in acute distress.    Appearance: Normal appearance. He is well-developed.  HENT:     Head: Normocephalic and atraumatic.  Eyes:     Conjunctiva/sclera: Conjunctivae normal.     Pupils: Pupils are equal, round, and reactive to light.  Cardiovascular:     Rate and Rhythm: Normal rate and regular rhythm.     Heart sounds: Normal heart sounds.  Pulmonary:     Effort: Pulmonary effort is normal. No respiratory distress.     Breath sounds: Normal breath sounds.  Abdominal:     General: There is no distension.     Palpations: Abdomen is soft.     Tenderness: There is no abdominal tenderness.  Musculoskeletal:        General: No deformity. Normal range of motion.     Cervical back: Normal range of motion and neck supple.  Skin:    General: Skin is warm and dry.  Neurological:     General: No focal deficit present.     Mental Status: He is alert and oriented to person, place, and time.     ED Results / Procedures / Treatments   Labs (all labs ordered are listed, but only abnormal results are displayed) Labs Reviewed  COMPREHENSIVE METABOLIC PANEL - Abnormal; Notable for the following components:      Result Value   Sodium 132 (*)    Chloride 93 (*)    Glucose, Bld 118 (*)    All other components within normal limits  CBC - Abnormal; Notable for the following components:   WBC 21.5 (*)    MCV 79.8 (*)    MCHC 37.2 (*)    All other components within normal limits  URINALYSIS, ROUTINE W REFLEX MICROSCOPIC - Abnormal; Notable for the following components:   Protein, ur 30 (*)    All other components within normal limits  LIPASE, BLOOD  TROPONIN  I (HIGH SENSITIVITY)  TROPONIN I (HIGH SENSITIVITY)    EKG EKG Interpretation Date/Time:  Sunday February 18 2023 10:53:14 EDT Ventricular Rate:  63 PR Interval:  140 QRS Duration:  84 QT Interval:  413 QTC Calculation: 423 R Axis:   100  Text Interpretation: Sinus rhythm Consider right atrial enlargement New t wave inversions inferior Confirmed by Kristine Royal [phone]) on 02/18/2023 10:59:26 AM  Radiology CT ABDOMEN PELVIS W CONTRAST  Result Date: 02/18/2023 CLINICAL DATA:  57 year old male with abdominal pain. EXAM: CT ABDOMEN AND PELVIS WITH CONTRAST TECHNIQUE: Multidetector CT imaging of the abdomen and pelvis was performed using the standard protocol following bolus administration of intravenous contrast. RADIATION DOSE REDUCTION: This exam was performed according to the departmental dose-optimization program which includes automated exposure control, adjustment of the mA and/or kV according to patient size and/or use of iterative reconstruction technique. CONTRAST:  75mL OMNIPAQUE IOHEXOL 350 MG/ML SOLN COMPARISON:  CT Abdomen and Pelvis 02/10/2018. FINDINGS: Lower chest: Negative.  No pericardial or pleural effusion. Hepatobiliary: Negative liver and gallbladder. Pancreas: Negative. Spleen: Negative. Adrenals/Urinary Tract: Stable bilateral adrenal gland thickening, possibly adrenal hyperplasia. Kidneys are nonobstructed with symmetric renal enhancement, and appropriate contrast excretion on the delayed images. Nondilated ureters. No nephrolithiasis. No evidence of renal inflammation. Diminutive urinary bladder. Occasional pelvic phleboliths. Stomach/Bowel: No dilated large or small bowel. Mostly decompressed large bowel. Normal appendix series 3, image 66. No convincing large bowel inflammation. Mild diverticulosis of the sigmoid colon. Small bowel loops appear within normal limits. Small volume gas and fluid in the stomach. Duodenum appears negative. No free air, free fluid, or mesenteric  inflammation identified. Vascular/Lymphatic: Aortoiliac calcified atherosclerosis. Normal caliber abdominal aorta. Major arterial structures remain patent. Portal venous system is patent. No lymphadenopathy identified. Reproductive: Negative. Other: No pelvis free fluid. Musculoskeletal: No acute osseous abnormality identified. Chronic sclerosis of the right inferior pubic ramus is unchanged since 2019 and appears benign. There is a chronic large subchondral cyst of the left acetabulum. IMPRESSION: 1. No acute or inflammatory process identified in the abdomen or pelvis. Normal appendix. 2.  Aortic Atherosclerosis (ICD10-I70.0). Electronically Signed   By: Odessa Fleming M.D.   On: 02/18/2023 12:38    Procedures Procedures    Medications Ordered in ED Medications  sodium chloride 0.9 % bolus 1,000 mL (0 mLs Intravenous Stopped 02/18/23 1214)  ondansetron (ZOFRAN) injection 4 mg (4 mg Intravenous Given 02/18/23 1046)  famotidine (PEPCID) IVPB 20 mg premix (0 mg Intravenous Stopped 02/18/23 1211)  sodium chloride 0.9 % bolus 1,000 mL (  1,000 mLs Intravenous New Bag/Given 02/18/23 1233)  iohexol (OMNIPAQUE) 350 MG/ML injection 75 mL (75 mLs Intravenous Contrast Given 02/18/23 1224)  morphine (PF) 4 MG/ML injection 4 mg (4 mg Intravenous Given 02/18/23 1252)  acetaminophen (TYLENOL) tablet 1,000 mg (1,000 mg Oral Given 02/18/23 1248)    ED Course/ Medical Decision Making/ A&P                             Medical Decision Making Amount and/or Complexity of Data Reviewed Labs: ordered. Radiology: ordered.  Risk OTC drugs. Prescription drug management.    Medical Screen Complete  This patient presented to the ED with complaint of nausea and vomiting.  This complaint involves an extensive number of treatment options. The initial differential diagnosis includes, but is not limited to, metabolic abnormality, dehydration, AKI, intra-abdominal pathology such as obstruction  This presentation is: Acute,  Self-Limited, Previously Undiagnosed, Uncertain Prognosis, Complicated, Systemic Symptoms, and Threat to Life/Bodily Function  Presents with complaint of significant nausea and vomiting times roughly 36 hours.  With IV fluids, antiemetics patient feels significantly improved.  Screening labs on the whole are without significant abnormality.  Patient's white count is elevated at 21 however this is most likely secondary to stress response from multiple rounds of emesis.  CT imaging of the abdomen and pelvis are without acute abnormality.  Patient feels significantly improved on reevaluation.  He is taking good p.o.  Patient understands need for close outpatient follow-up.  Patient without findings suggestive of acute coronary syndrome.  However, patient with T wave inversions that appear to be new as compared to prior EKGs.  Referral made for cardiology outpatient evaluation.  Patient understands need for close outpatient follow-up with cardiology.  Strict return precautions given and understood. Additional history obtained:  External records from outside sources obtained and reviewed including prior ED visits and prior Inpatient records.    Lab Tests:  I ordered and personally interpreted labs.    Imaging Studies ordered:  I ordered imaging studies including CT abdomen pelvis I independently visualized and interpreted obtained imaging which showed NAD I agree with the radiologist interpretation.   Cardiac Monitoring:  The patient was maintained on a cardiac monitor.  I personally viewed and interpreted the cardiac monitor which showed an underlying rhythm of: NSR   Medicines ordered:  I ordered medication including IV fluids, antiemetics, analgesics for abdominal pain, vomiting Reevaluation of the patient after these medicines showed that the patient: improved  Problem List / ED Course:  Nausea and vomiting   Reevaluation:  After the interventions noted above, I  reevaluated the patient and found that they have: improved   Disposition:  After consideration of the diagnostic results and the patients response to treatment, I feel that the patent would benefit from close outpatient follow-up.          Final Clinical Impression(s) / ED Diagnoses Final diagnoses:  Nausea and vomiting, unspecified vomiting type  T wave inversion in EKG    Rx / DC Orders ED Discharge Orders     None         Wynetta Fines, MD 02/19/23 2246

## 2023-02-18 NOTE — ED Notes (Signed)
Patient transported to CT 

## 2023-12-10 ENCOUNTER — Ambulatory Visit: Admitting: Orthopaedic Surgery

## 2024-02-07 ENCOUNTER — Encounter (HOSPITAL_COMMUNITY): Payer: Self-pay | Admitting: *Deleted

## 2024-02-07 ENCOUNTER — Emergency Department (HOSPITAL_COMMUNITY)

## 2024-02-07 ENCOUNTER — Emergency Department (HOSPITAL_COMMUNITY)
Admission: EM | Admit: 2024-02-07 | Discharge: 2024-02-07 | Disposition: A | Discharge status: Home / Self Care | Attending: Emergency Medicine | Admitting: Emergency Medicine

## 2024-02-07 ENCOUNTER — Other Ambulatory Visit: Payer: Self-pay

## 2024-02-07 DIAGNOSIS — M25551 Pain in right hip: Secondary | ICD-10-CM | POA: Diagnosis present

## 2024-02-07 DIAGNOSIS — Z7982 Long term (current) use of aspirin: Secondary | ICD-10-CM | POA: Diagnosis not present

## 2024-02-07 DIAGNOSIS — R030 Elevated blood-pressure reading, without diagnosis of hypertension: Secondary | ICD-10-CM | POA: Diagnosis not present

## 2024-02-07 DIAGNOSIS — M1611 Unilateral primary osteoarthritis, right hip: Secondary | ICD-10-CM | POA: Insufficient documentation

## 2024-02-07 MED ORDER — IBUPROFEN 400 MG PO TABS
400.0000 mg | ORAL_TABLET | Freq: Once | ORAL | Status: AC
  Administered 2024-02-07: 400 mg via ORAL
  Filled 2024-02-07: qty 1

## 2024-02-07 MED ORDER — TRAMADOL HCL 50 MG PO TABS: 50.0000 mg | ORAL_TABLET | Freq: Four times a day (QID) | ORAL | 0 refills | Status: AC | PRN

## 2024-02-07 MED ORDER — TRAMADOL HCL 50 MG PO TABS
50.0000 mg | ORAL_TABLET | Freq: Once | ORAL | Status: AC
  Administered 2024-02-07: 50 mg via ORAL
  Filled 2024-02-07: qty 1

## 2024-02-07 NOTE — ED Notes (Signed)
 Pt arrived with c/o of right hip pain and fluid build up. Pt rates the pain 10/10 and wants to have fluid drained.

## 2024-02-07 NOTE — ED Provider Notes (Signed)
 [place] EMERGENCY DEPARTMENT AT [facility] Provider Note   CSN: [id number] Arrival date & time: 02/07/24  1014     Patient presents with: Hip Pain   Lance Stone is a 58 y.o. male.   Pt with c/o right hip pain. States chronic hip pain in past, was told due to osteoarthritis. Notes hurting more in past couple weeks. No leg swelling. No redness or warmth to hip. No new low back pain or radicular pain. No leg numbness/weakness. Denies fever or chills. Indicates otherwise does not feel acutely ill or sick. No trauma/fall. Is ambulatory, indicates took bus here. No other joint pain.   The history is provided by the patient and medical records.  Hip Pain Pertinent negatives include no chest pain, no abdominal pain and no shortness of breath.       Prior to Admission medications   Medication Sig Start Date End Date Taking? Authorizing Provider  albuterol  (VENTOLIN  HFA) 108 (90 Base) MCG/ACT inhaler Inhale 1-2 puffs into the lungs every 6 (six) hours as needed for wheezing or shortness of breath. 06/26/22   Franklyn Sid SAILOR, MD  aspirin 500 MG EC tablet Take 500 mg by mouth every 6 (six) hours as needed for pain.    [provider]  ibuprofen  (ADVIL ,MOTRIN ) 200 MG tablet Take 200 mg by mouth every 6 (six) hours as needed for fever, headache or mild pain.     [provider]  levofloxacin  (LEVAQUIN ) 750 MG tablet Take 1 tablet (750 mg total) by mouth daily. Patient not taking: Reported on 03/28/2018 02/09/18   Odis Burnard Jansky, PA-C  ondansetron  (ZOFRAN  ODT) 4 MG disintegrating tablet Take 1 tablet (4 mg total) by mouth every 8 (eight) hours as needed for nausea or vomiting. 03/28/18   Windle Almarie ORN, PA-C  ondansetron  (ZOFRAN ) 4 MG tablet Take 1 tablet (4 mg total) by mouth every 6 (six) hours. 02/18/23   Laurice Maude BROCKS, MD  promethazine  (PHENERGAN ) 25 MG tablet Take 1 tablet (25 mg total) by mouth every 6 (six) hours as needed for nausea or  vomiting. Patient not taking: Reported on 03/28/2018 02/10/18   Dean Clarity, MD    Allergies: Patient has no known allergies.    Review of Systems  Constitutional:  Negative for chills and fever.  Respiratory:  Negative for shortness of breath.   Cardiovascular:  Negative for chest pain.  Gastrointestinal:  Negative for abdominal pain.  Musculoskeletal:  Negative for back pain.  Skin:  Negative for rash and wound.  Neurological:  Negative for weakness and numbness.    Updated Vital Signs BP (!) 132/95 (BP Location: Right Arm)   Pulse 69   Temp 98.4 F (36.9 C) (Oral)   Resp 17   SpO2 100%   Physical Exam Vitals and nursing note reviewed.  Constitutional:      Appearance: Normal appearance. He is well-developed.  Eyes:     General: No scleral icterus.    Conjunctiva/sclera: Conjunctivae normal.  Neck:     Trachea: No tracheal deviation.  Cardiovascular:     Rate and Rhythm: Normal rate.     Pulses: Normal pulses.  Pulmonary:     Effort: Pulmonary effort is normal. No accessory muscle usage or respiratory distress.  Abdominal:     General: There is no distension.     Palpations: Abdomen is soft. There is no mass.     Tenderness: There is no abdominal tenderness.  Musculoskeletal:  General: No swelling.     Cervical back: Normal range of motion and neck supple.     Comments: LS spine non tender, aligned. Tenderness right hip, mild click sensation with rom. No pain w passive rom at hip or knee. RLE is of normal color and warmth, no swelling, with intact distal pulses.   Skin:    General: Skin is warm and dry.     Findings: No rash.  Neurological:     Mental Status: He is alert.     Comments: Alert, speech clear. RLE nvi with intact motor/sens fxn.   Psychiatric:        Mood and Affect: Mood normal.     (all labs ordered are listed, but only abnormal results are displayed) Labs Reviewed - No data to display  EKG: None  Radiology: DG Hip Unilat  With  Pelvis 2-3 Views Right Result Date: 02/07/2024 CLINICAL DATA:  Left hip pain. EXAM: DG HIP (WITH OR WITHOUT PELVIS) 2-3V RIGHT COMPARISON:  Right hip radiograph dated 06/26/2022. FINDINGS: There is no acute fracture or dislocation. The bones are osteopenic. Bilateral hip arthritic changes with moderate joint space narrowing. The soft tissues are unremarkable IMPRESSION: 1. No acute fracture or dislocation. 2. Bilateral hip arthritic changes. Electronically Signed   By: Vanetta Chou M.D.   On: 02/07/2024 11:21     Procedures   Medications Ordered in the ED  ibuprofen  (ADVIL ) tablet 400 mg (has no administration in time range)  traMADol (ULTRAM) tablet 50 mg (has no administration in time range)                                    Medical Decision Making Problems Addressed: Elevated blood pressure reading: acute illness or injury Osteoarthritis of right hip, unspecified osteoarthritis type: chronic illness or injury Right hip pain: acute illness or injury    Details: Acute and chronic  Amount and/or Complexity of Data Reviewed External Data Reviewed: notes. Radiology: ordered and independent interpretation performed. Decision-making details documented in ED Course.  Risk Prescription drug management.   Imaging ordered from triage.   Reviewed nursing notes and prior charts for additional history.   No meds pta.   Ultram po. Ibuprofen  po.  Xrays reviewed/interpreted by me - no fx. Degen changes.   Rx for home.   Pt currently appears stable for ed d/c.   Rec pcp and ortho f/u.  Return precautions provided.      Final diagnoses:  Right hip pain  Osteoarthritis of right hip, unspecified osteoarthritis type  Elevated blood pressure reading    ED Discharge Orders     None          Bernard Drivers, MD 02/07/24 1157

## 2024-02-07 NOTE — Discharge Instructions (Addendum)
 It was our pleasure to provide your ER care today - we hope that you feel better. Your xrays look similar to previous, no new fracture or new finding is noted.   Take acetaminophen  or ibuprofen  as need. You may also take ultram as need for pain - no driving when taking.   Follow up with orthopedist in 1-2 weeks if symptoms fail to improve/resolve.   Your blood pressure is mildly high - follow up with primary care doctor in the next couple weeks.   Return to ER if worse, new symptoms, fevers, leg swelling, intractable pain, numbness/weakness, or other concern.

## 2024-02-07 NOTE — ED Triage Notes (Signed)
 Pt is here for increasing pain x3 weeks in right, not related to any trauma.  Pt has chronic right hip pain.

## 2024-05-07 ENCOUNTER — Encounter (HOSPITAL_COMMUNITY): Payer: Self-pay | Admitting: Oral Surgery

## 2024-05-07 ENCOUNTER — Other Ambulatory Visit: Payer: Self-pay

## 2024-05-07 NOTE — Progress Notes (Signed)
 PCP - NO PCP Cardiologist - denies  PPM/ICD - denies Device Orders - n/a Rep Notified - n/a  Chest x-ray - denies EKG - DOS Stress Test - denies ECHO - denies Cardiac Cath - denies  CPAP - Denies  DM -denies  Blood Thinner Instructions: denies Aspirin Instructions: denies  ERAS Protcol - NPO  COVID TEST- n/a  Anesthesia review: no. Per patient he was not aware of dx of HTN and he said he was told as a child he had a heart murmur, but believed he grew out of it. Denies any chest pain or discomfort  Patient verbally denies any shortness of breath, fever, cough and chest pain during phone call   -------------  SDW INSTRUCTIONS given:  Your procedure is scheduled on May 09, 2024.  Report to [facility] Main Entrance A at 6:10 A.M., and check in at the Admitting office.  Call this number if you have problems the morning of surgery:  [phone]   Remember:  Do not eat or drink after midnight the night before your surgery      Take these medicines the morning of surgery with A SIP OF WATER n/a  As of today, STOP taking any Aspirin (unless otherwise instructed by your surgeon) Aleve , Naproxen , Ibuprofen , Motrin , Advil , Goody's, BC's, all herbal medications, fish oil, and all vitamins.                      Do not wear jewelry, make up, or nail polish            Do not wear lotions, powders, perfumes/colognes, or deodorant.            Do not shave 48 hours prior to surgery.  Men may shave face and neck.            Do not bring valuables to the hospital.            [facility] is not responsible for any belongings or valuables.  Do NOT Smoke (Tobacco/Vaping) 24 hours prior to your procedure If you use a CPAP at night, you may bring all equipment for your overnight stay.   Contacts, glasses, dentures or bridgework may not be worn into surgery.      For patients admitted to the hospital, discharge time will be determined by your treatment team.   Patients  discharged the day of surgery will not be allowed to drive home, and someone needs to stay with them for 24 hours.    Special instructions:   [facility]- Preparing For Surgery  Before surgery, you can play an important role. Because skin is not sterile, your skin needs to be as free of germs as possible. You can reduce the number of germs on your skin by washing with CHG (chlorahexidine gluconate) Soap before surgery.  CHG is an antiseptic cleaner which kills germs and bonds with the skin to continue killing germs even after washing.    Oral Hygiene is also important to reduce your risk of infection.  Remember - BRUSH YOUR TEETH THE MORNING OF SURGERY WITH YOUR REGULAR TOOTHPASTE  Please do not use if you have an allergy to CHG or antibacterial soaps. If your skin becomes reddened/irritated stop using the CHG.  Do not shave (including legs and underarms) for at least 48 hours prior to first CHG shower. It is OK to shave your face.  Please follow these instructions carefully.   Shower the NIGHT BEFORE SURGERY and the [facility]  OF SURGERY with DIAL Soap.   Pat yourself dry with a CLEAN TOWEL.  Wear CLEAN PAJAMAS to bed the night before surgery  Place CLEAN SHEETS on your bed the night of your first shower and DO NOT SLEEP WITH PETS.   Day of Surgery: Please shower morning of surgery  Wear Clean/Comfortable clothing the morning of surgery Do not apply any deodorants/lotions.   Remember to brush your teeth WITH YOUR REGULAR TOOTHPASTE.   Questions were answered. Patient verbalized understanding of instructions.

## 2024-05-07 NOTE — H&P (Signed)
  Patient: Lance Stone  PID: [id number]  DOB: Dec 10, 1965  SEX: Male   Patient referred by DDS for extraction all upper teeth and 23, 24, 25, 26, 30, 31.   CC: Bad teeth.  Past Medical History:  Heart Murmur, Weed    Medications: None    Allergies:     NKDA    Surgeries:   Tonsillectomy     Social History       Smoking:            Alcohol: Drug use:                             Exam: BMI 26. Multiple caries teeth # 3, 6, 7, 8, 9, 11, 12, 15, 23, 24, 25, 26, 30, 31. Bilateral mandibular lingual tori.  No purulence, edema, fluctuance, trismus. Oral cancer screening negative. Pharynx clear. No lymphadenopathy.  Panorex: Multiple caries teeth # 3, 6, 7, 8, 9, 11, 12, 15, 23, 24, 25, 26, 30, 31.  Assessment: ASA . Non-restorable  teeth # 3, 6, 7, 8, 9, 11, 12, 15, 23, 24, 25, 26, 30, 31, bilateral mandibular lingual tori.              Plan: Extraction teeth # 3, 6, 7, 8, 9, 11, 12, 15, 23, 24, 25, 26, 30, 31. Alveoloplasty. Removal mandibular lingual tori.   Hospital Day surgery.                 Rx: n               Risks and complications explained. Questions answered.   Glendia EMERSON Primrose, DMD

## 2024-05-09 ENCOUNTER — Encounter (HOSPITAL_COMMUNITY)
Admission: RE | Disposition: A | Discharge status: Home / Self Care | Payer: Self-pay | Source: Home / Self Care | Attending: Oral Surgery

## 2024-05-09 ENCOUNTER — Ambulatory Visit (HOSPITAL_COMMUNITY)
Admission: RE | Admit: 2024-05-09 | Discharge: 2024-05-09 | Disposition: A | Discharge status: Home / Self Care | Attending: Oral Surgery | Admitting: Oral Surgery

## 2024-05-09 DIAGNOSIS — M270 Developmental disorders of jaws: Secondary | ICD-10-CM | POA: Insufficient documentation

## 2024-05-09 DIAGNOSIS — K029 Dental caries, unspecified: Secondary | ICD-10-CM | POA: Diagnosis present

## 2024-05-09 DIAGNOSIS — Z538 Procedure and treatment not carried out for other reasons: Secondary | ICD-10-CM | POA: Diagnosis not present

## 2024-05-09 HISTORY — DX: Unspecified osteoarthritis, unspecified site: M19.90

## 2024-05-09 SURGERY — DENTAL RESTORATION/EXTRACTIONS
Anesthesia: General

## 2024-05-09 NOTE — Progress Notes (Signed)
 On arrival pt was informed that surgeon was sick and that surgery needed to be rescheduled. Pt verbalized understanding. Pt discharged from short stay.   Joesph LOISE Stake, RN

## 2024-05-15 ENCOUNTER — Encounter (HOSPITAL_COMMUNITY): Admission: RE | Payer: Self-pay | Source: Home / Self Care

## 2024-05-15 ENCOUNTER — Ambulatory Visit (HOSPITAL_COMMUNITY): Admission: RE | Admit: 2024-05-15 | Source: Home / Self Care | Admitting: Oral Surgery

## 2024-05-15 SURGERY — DENTAL RESTORATION/EXTRACTIONS
Anesthesia: General

## 2024-06-09 ENCOUNTER — Encounter: Payer: Self-pay | Admitting: Radiology

## 2024-09-03 ENCOUNTER — Emergency Department (HOSPITAL_COMMUNITY)
Admission: EM | Admit: 2024-09-03 | Discharge: 2024-09-03 | Disposition: A | Discharge status: Home / Self Care | Attending: Emergency Medicine | Admitting: Emergency Medicine

## 2024-09-03 ENCOUNTER — Emergency Department (HOSPITAL_COMMUNITY)

## 2024-09-03 ENCOUNTER — Other Ambulatory Visit: Payer: Self-pay

## 2024-09-03 DIAGNOSIS — R0789 Other chest pain: Secondary | ICD-10-CM | POA: Insufficient documentation

## 2024-09-03 DIAGNOSIS — F1290 Cannabis use, unspecified, uncomplicated: Secondary | ICD-10-CM | POA: Insufficient documentation

## 2024-09-03 LAB — TROPONIN T, HIGH SENSITIVITY
Troponin T High Sensitivity: <6 ng/L (ref 0–19)
Troponin T High Sensitivity: 8 ng/L (ref 0–19)

## 2024-09-03 LAB — BASIC METABOLIC PANEL WITH GFR
Anion gap: 8 (ref 5–15)
BUN: 16 mg/dL (ref 6–20)
CO2: 28 mmol/L (ref 22–32)
Calcium: 9.5 mg/dL (ref 8.9–10.3)
Chloride: 105 mmol/L (ref 98–111)
Creatinine, Ser: 0.97 mg/dL (ref 0.61–1.24)
GFR, Estimated: >60 mL/min
Glucose, Bld: 96 mg/dL (ref 70–99)
Potassium: 5.7 mmol/L — ABNORMAL HIGH (ref 3.5–5.1)
Sodium: 141 mmol/L (ref 135–145)

## 2024-09-03 LAB — I-STAT CHEM 8, ED
BUN: 19 mg/dL (ref 6–20)
Calcium, Ion: 1.15 mmol/L (ref 1.15–1.40)
Chloride: 104 mmol/L (ref 98–111)
Creatinine, Ser: 1.1 mg/dL (ref 0.61–1.24)
Glucose, Bld: 90 mg/dL (ref 70–99)
HCT: 46 % (ref 39.0–52.0)
Hemoglobin: 15.6 g/dL (ref 13.0–17.0)
Potassium: 5.2 mmol/L — ABNORMAL HIGH (ref 3.5–5.1)
Sodium: 142 mmol/L (ref 135–145)
TCO2: 28 mmol/L (ref 22–32)

## 2024-09-03 LAB — CBC WITH DIFFERENTIAL/PLATELET
Abs Immature Granulocytes: 0.03 10*3/uL (ref 0.00–0.07)
Basophils Absolute: 0 10*3/uL (ref 0.0–0.1)
Basophils Relative: 1 %
Eosinophils Absolute: 0.1 10*3/uL (ref 0.0–0.5)
Eosinophils Relative: 1 %
HCT: 41.2 % (ref 39.0–52.0)
Hemoglobin: 15.1 g/dL (ref 13.0–17.0)
Immature Granulocytes: 0 %
Lymphocytes Relative: 31 %
Lymphs Abs: 2.8 10*3/uL (ref 0.7–4.0)
MCH: 29.8 pg (ref 26.0–34.0)
MCHC: 36.7 g/dL — ABNORMAL HIGH (ref 30.0–36.0)
MCV: 81.3 fL (ref 80.0–100.0)
Monocytes Absolute: 1 10*3/uL (ref 0.1–1.0)
Monocytes Relative: 11 %
Neutro Abs: 5 10*3/uL (ref 1.7–7.7)
Neutrophils Relative %: 56 %
Platelets: 290 10*3/uL (ref 150–400)
RBC: 5.07 MIL/uL (ref 4.22–5.81)
RDW: 14.1 % (ref 11.5–15.5)
WBC: 8.9 10*3/uL (ref 4.0–10.5)
nRBC: 0 % (ref 0.0–0.2)

## 2024-09-03 LAB — URINE DRUG SCREEN
Amphetamines: NEGATIVE
Barbiturates: NEGATIVE
Benzodiazepines: NEGATIVE
Cocaine: NEGATIVE
Fentanyl: NEGATIVE
Methadone Scn, Ur: NEGATIVE
Opiates: NEGATIVE
Tetrahydrocannabinol: POSITIVE — AB

## 2024-09-03 MED ORDER — KETOROLAC TROMETHAMINE 15 MG/ML IJ SOLN
15.0000 mg | Freq: Once | INTRAMUSCULAR | Status: AC
  Administered 2024-09-03: 15 mg via INTRAVENOUS
  Filled 2024-09-03: qty 1

## 2024-09-03 MED ORDER — ACETAMINOPHEN 500 MG PO TABS
1000.0000 mg | ORAL_TABLET | Freq: Once | ORAL | Status: AC
  Administered 2024-09-03: 1000 mg via ORAL
  Filled 2024-09-03: qty 2

## 2024-09-03 NOTE — Discharge Instructions (Signed)
 Return for any problem.   Follow up with Cardiology as instructed.

## 2024-09-03 NOTE — ED Notes (Signed)
 Pt to xray

## 2024-09-03 NOTE — ED Triage Notes (Signed)
 Pt bib [facility] from home c/o L rib pain beginning yesterday. No injury, hurts more when he twists, feels like he pulled a muslce. VSS

## 2024-09-03 NOTE — ED Provider Notes (Signed)
 " [place] [facility] Provider Note   CSN: [id number] Arrival date & time: 09/03/24  9146     Patient presents with: No chief complaint on file.   Lance Stone is a 59 y.o. male.   59 year old male with prior medical history as detailed below presents for evaluation.  Patient complains of some left lower anterior rib pain.  Pain began yesterday.  He denies any specific injury.  He reports that when he presses on his left lower ribs or when he twists or when he lifts something he feels pain.  He denies chest pain or shortness of breath.  He denies nausea or vomiting.  He did not take anything today for his symptoms.  The history is provided by the patient and medical records.       Prior to Admission medications  Medication Sig Start Date End Date Taking? Authorizing Provider  traMADol  (ULTRAM ) 50 MG tablet Take 1 tablet (50 mg total) by mouth every 6 (six) hours as needed. Patient not taking: Reported on 05/07/2024 02/07/24   Steinl, Kevin, MD    Allergies: Patient has no known allergies.    Review of Systems  All other systems reviewed and are negative.   Updated Vital Signs BP 129/78   Pulse 76   Temp 98.1 F (36.7 C) (Oral)   Resp 16   SpO2 100%   Physical Exam Vitals and nursing note reviewed.  Constitutional:      General: He is not in acute distress.    Appearance: He is well-developed.  HENT:     Head: Normocephalic and atraumatic.  Eyes:     Conjunctiva/sclera: Conjunctivae normal.  Cardiovascular:     Rate and Rhythm: Normal rate and regular rhythm.     Heart sounds: No murmur heard. Pulmonary:     Effort: Pulmonary effort is normal. No respiratory distress.     Breath sounds: Normal breath sounds.     Comments: Mild tenderness over the anterior left inferior ribs.  No appreciable rash, edema, step-off, crepitus. Abdominal:     Palpations: Abdomen is soft.     Tenderness: There is no abdominal tenderness.   Musculoskeletal:        General: No swelling.     Cervical back: Neck supple.  Skin:    General: Skin is warm and dry.     Capillary Refill: Capillary refill takes less than 2 seconds.  Neurological:     Mental Status: He is alert.  Psychiatric:        Mood and Affect: Mood normal.     (all labs ordered are listed, but only abnormal results are displayed) Labs Reviewed - No data to display  EKG: None  Radiology: No results found.   Procedures   Medications Ordered in the ED - No data to display                                  Medical Decision Making Patient presents with left lower anterior lateral rib pain.  Described symptoms are most suggestive of musculoskeletal strain.   Obtained imaging is reassuringly without significant acute abnormality identified.  Initial EKG is slightly suggestive of pericarditis.  However patient's symptoms are not consistent with pericarditis.  Additionally troponins are without significant elevation or delta.  Other obtained screening labs are also without significant acute abnormality.  Patient is significantly improved after administration of Tylenol  and  Toradol .  He desires discharge home.  He is advised to follow-up closely with cardiology for further evaluation.  He is advised to return to the ED if he has worsening pain or any type of other chest pain or shortness of breath.    Amount and/or Complexity of Data Reviewed Labs: ordered. Radiology: ordered.  Risk OTC drugs. Prescription drug management.        Final diagnoses:  Rib pain  Atypical chest pain  Marijuana smoker    ED Discharge Orders          Ordered    Ambulatory referral to Cardiology        09/03/24 1318               Laurice Maude BROCKS, MD 09/03/24 1322  "

## 2024-10-10 ENCOUNTER — Ambulatory Visit (HOSPITAL_COMMUNITY): Admit: 2024-10-10 | Admitting: Oral Surgery

## 2024-10-10 SURGERY — DENTAL RESTORATION/EXTRACTIONS
Anesthesia: General
